# Patient Record
Sex: Female | Born: 1950 | Race: White | Hispanic: No | Marital: Married | Smoking: Never smoker
Health system: Southern US, Community
[De-identification: ages and names within clinical notes are randomized; demographics above are authoritative.]

## PROBLEM LIST (undated history)

## (undated) DIAGNOSIS — I4891 Unspecified atrial fibrillation: Secondary | ICD-10-CM

## (undated) DIAGNOSIS — G4733 Obstructive sleep apnea (adult) (pediatric): Secondary | ICD-10-CM

## (undated) DIAGNOSIS — J45909 Unspecified asthma, uncomplicated: Secondary | ICD-10-CM

## (undated) DIAGNOSIS — E119 Type 2 diabetes mellitus without complications: Secondary | ICD-10-CM

## (undated) DIAGNOSIS — I2510 Atherosclerotic heart disease of native coronary artery without angina pectoris: Secondary | ICD-10-CM

## (undated) DIAGNOSIS — I5030 Unspecified diastolic (congestive) heart failure: Secondary | ICD-10-CM

## (undated) DIAGNOSIS — E785 Hyperlipidemia, unspecified: Secondary | ICD-10-CM

## (undated) HISTORY — PX: ORIF DISTAL RADIUS FRACTURE: SUR927

## (undated) HISTORY — DX: Hyperlipidemia, unspecified: E78.5

## (undated) HISTORY — DX: Unspecified atrial fibrillation: I48.91

## (undated) HISTORY — DX: Unspecified asthma, uncomplicated: J45.909

## (undated) HISTORY — DX: Type 2 diabetes mellitus without complications: E11.9

## (undated) HISTORY — DX: Atherosclerotic heart disease of native coronary artery without angina pectoris: I25.10

## (undated) HISTORY — DX: Unspecified diastolic (congestive) heart failure: I50.30

## (undated) HISTORY — DX: Obstructive sleep apnea (adult) (pediatric): G47.33

---

## 2008-03-04 ENCOUNTER — Ambulatory Visit: Payer: Self-pay | Admitting: Emergency Medicine

## 2010-01-25 ENCOUNTER — Ambulatory Visit: Payer: Self-pay | Admitting: Family Medicine

## 2011-04-17 DIAGNOSIS — E782 Mixed hyperlipidemia: Secondary | ICD-10-CM | POA: Insufficient documentation

## 2011-04-17 DIAGNOSIS — I10 Essential (primary) hypertension: Secondary | ICD-10-CM | POA: Insufficient documentation

## 2011-04-28 DIAGNOSIS — M5440 Lumbago with sciatica, unspecified side: Secondary | ICD-10-CM | POA: Insufficient documentation

## 2011-06-16 ENCOUNTER — Ambulatory Visit: Payer: Self-pay | Admitting: Internal Medicine

## 2012-04-16 DIAGNOSIS — J45909 Unspecified asthma, uncomplicated: Secondary | ICD-10-CM | POA: Insufficient documentation

## 2013-01-15 DIAGNOSIS — G629 Polyneuropathy, unspecified: Secondary | ICD-10-CM | POA: Insufficient documentation

## 2013-01-15 DIAGNOSIS — R7989 Other specified abnormal findings of blood chemistry: Secondary | ICD-10-CM | POA: Insufficient documentation

## 2013-02-18 ENCOUNTER — Ambulatory Visit: Payer: Self-pay | Admitting: Family Medicine

## 2013-03-02 ENCOUNTER — Ambulatory Visit: Payer: Self-pay | Admitting: Family Medicine

## 2013-04-01 ENCOUNTER — Ambulatory Visit: Payer: Self-pay | Admitting: Family Medicine

## 2014-07-21 DIAGNOSIS — D72829 Elevated white blood cell count, unspecified: Secondary | ICD-10-CM | POA: Insufficient documentation

## 2014-08-20 DIAGNOSIS — I25119 Atherosclerotic heart disease of native coronary artery with unspecified angina pectoris: Secondary | ICD-10-CM | POA: Insufficient documentation

## 2014-09-10 DIAGNOSIS — Z6841 Body Mass Index (BMI) 40.0 and over, adult: Secondary | ICD-10-CM | POA: Insufficient documentation

## 2016-06-26 DIAGNOSIS — G4733 Obstructive sleep apnea (adult) (pediatric): Secondary | ICD-10-CM | POA: Insufficient documentation

## 2017-04-01 DEATH — deceased

## 2018-10-09 DIAGNOSIS — Z794 Long term (current) use of insulin: Secondary | ICD-10-CM | POA: Insufficient documentation

## 2018-10-09 DIAGNOSIS — D563 Thalassemia minor: Secondary | ICD-10-CM | POA: Insufficient documentation

## 2018-10-09 DIAGNOSIS — E1129 Type 2 diabetes mellitus with other diabetic kidney complication: Secondary | ICD-10-CM | POA: Insufficient documentation

## 2019-04-30 DIAGNOSIS — I4891 Unspecified atrial fibrillation: Secondary | ICD-10-CM | POA: Insufficient documentation

## 2020-05-31 ENCOUNTER — Ambulatory Visit (INDEPENDENT_AMBULATORY_CARE_PROVIDER_SITE_OTHER): Payer: Medicare Other | Admitting: Internal Medicine

## 2020-05-31 DIAGNOSIS — Z6841 Body Mass Index (BMI) 40.0 and over, adult: Secondary | ICD-10-CM | POA: Diagnosis not present

## 2020-05-31 DIAGNOSIS — J45909 Unspecified asthma, uncomplicated: Secondary | ICD-10-CM

## 2020-05-31 DIAGNOSIS — G4733 Obstructive sleep apnea (adult) (pediatric): Secondary | ICD-10-CM | POA: Diagnosis not present

## 2020-05-31 NOTE — Progress Notes (Signed)
[facility] [address] [place], [place] [address]  Pulmonary Sleep Medicine   Office Visit Note  Patient Name: Sheila Bush DOB: 1951-06-30 MRN [id number]    Chief Complaint: Obstructive Sleep Apnea visit  Brief History:  Sheila Bush is seen today for follow upThe patient has a 4 year history of sleep apnea. Patient is using PAP nightly.  The patient feels more rested after sleeping with PAP. She is averaging 9 hours and sleeps through the night. The patient reports benefiting from PAP use. Reported sleepiness is  Improved and the Epworth Sleepiness Score is 2 out of 24. The patient occasionally nods off after church if things are quiet. The patient complains of the following: occasional runny nose. She decreased the humidifier instead of increasing. She would like to try another style FF mask The compliance download shows excellent compliance with an average use time of 9 hours. The AHI is 1.7 The patient does not complain of limb movements disrupting sleep.  ROS  General: (-) fever, (-) chills, (-) night sweat Nose and Sinuses: (-) nasal stuffiness or itchiness, (-) postnasal drip, (-) nosebleeds, (-) sinus trouble. Mouth and Throat: (-) sore throat, (-) hoarseness. Neck: (-) swollen glands, (-) enlarged thyroid, (-) neck pain. Respiratory: - cough, + shortness of breath, - wheezing. Neurologic: - numbness, - tingling. Psychiatric: - anxiety, - depression   Current Medication: Outpatient Encounter Medications as of 05/31/2020  Medication Sig  . dronedarone (MULTAQ) 400 MG tablet Take by mouth.  . fexofenadine (ALLEGRA) 180 MG tablet Take by mouth.  Marland Kitchen glipiZIDE (GLUCOTROL) 5 MG tablet Take by mouth.  . insulin glargine (LANTUS) 100 UNIT/ML injection Inject into the skin.  Marland Kitchen meclizine (ANTIVERT) 12.5 MG tablet Take by mouth.  . metFORMIN (GLUCOPHAGE) 1000 MG tablet Take by mouth.  . metoprolol succinate (TOPROL-XL) 100 MG 24 hr tablet Take by mouth.  . Potassium  Gluconate 2.5 MEQ TABS Take by mouth.  . Semaglutide,0.25 or 0.5MG /DOS, (OZEMPIC, 0.25 OR 0.5 MG/DOSE,) 2 MG/1.5ML SOPN Inject into the skin.  Marland Kitchen albuterol (VENTOLIN HFA) 108 (90 Base) MCG/ACT inhaler Inhale into the lungs.  Marland Kitchen amLODipine (NORVASC) 5 MG tablet Take 5 mg by mouth at bedtime.  Marland Kitchen atorvastatin (LIPITOR) 40 MG tablet Take 40 mg by mouth at bedtime.  . benazepril-hydrochlorthiazide (LOTENSIN HCT) 20-25 MG tablet Take 1 tablet by mouth 2 (two) times daily.  . Calcium Carb-Cholecalciferol (OYSTER SHELL CALCIUM) 500-400 MG-UNIT TABS Take by mouth.  . Cholecalciferol 50 MCG (2000 UT) TABS Take by mouth.  . cyanocobalamin 1000 MCG tablet Take by mouth.  Everlene Balls 5 MG TABS tablet Take 5 mg by mouth 2 (two) times daily.  . folic acid (FOLVITE) 800 MCG tablet Take by mouth.  . furosemide (LASIX) 20 MG tablet Take 10 mg by mouth daily.   No facility-administered encounter medications on file as of 05/31/2020.    Surgical History: History reviewed. No pertinent surgical history.  Medical History: Past Medical History:  Diagnosis Date  . Atrial fibrillation (HCC)   . Coronary artery disease   . Diastolic heart failure (HCC)   . DM (diabetes mellitus), type 2 (HCC)   . Hyperlipidemia   . OSA on CPAP   . Reactive airway disease     Family History: Non contributory to the present illness  Social History: Social History   Socioeconomic History  . Marital status: Married    Spouse name: Not on file  . Number of children: Not on file  . Years  of education: Not on file  . Highest education level: Not on file  Occupational History  . Not on file  Tobacco Use  . Smoking status: Never Smoker  . Smokeless tobacco: Never Used  Substance and Sexual Activity  . Alcohol use: Never  . Drug use: Never  . Sexual activity: Not on file  Other Topics Concern  . Not on file  Social History Narrative  . Not on file   Social Determinants of Health   Financial Resource Strain:   .  Difficulty of Paying Living Expenses: Not on file  Food Insecurity:   . Worried About Programme researcher, broadcasting/film/video in the Last Year: Not on file  . Ran Out of Food in the Last Year: Not on file  Transportation Needs:   . Lack of Transportation (Medical): Not on file  . Lack of Transportation (Non-Medical): Not on file  Physical Activity:   . Days of Exercise per Week: Not on file  . Minutes of Exercise per Session: Not on file  Stress:   . Feeling of Stress : Not on file  Social Connections:   . Frequency of Communication with Friends and Family: Not on file  . Frequency of Social Gatherings with Friends and Family: Not on file  . Attends Religious Services: Not on file  . Active Member of Clubs or Organizations: Not on file  . Attends Banker Meetings: Not on file  . Marital Status: Not on file  Intimate Partner Violence:   . Fear of Current or Ex-Partner: Not on file  . Emotionally Abused: Not on file  . Physically Abused: Not on file  . Sexually Abused: Not on file    Vital Signs: Blood pressure 136/70, pulse 81, height 5\' 3"  (1.6 m), weight 239 lb (108.4 kg), SpO2 96 %.  Examination: General Appearance: The patient is well-developed, well-nourished, and in no distress. Neck Circumference: 37 Skin: Gross inspection of skin unremarkable. Head: normocephalic, no gross deformities. Eyes: no gross deformities noted. ENT: ears appear grossly normal Neurologic: Alert and oriented. No involuntary movements.    EPWORTH SLEEPINESS SCALE:  Scale:  (0)= no chance of dozing; (1)= slight chance of dozing; (2)= moderate chance of dozing; (3)= high chance of dozing  Chance  Situtation    Sitting and reading: 0    Watching TV: 1    Sitting Inactive in public: 0    As a passenger in car: 0      Lying down to rest: 1    Sitting and talking: 0    Sitting quielty after lunch: 0    In a car, stopped in traffic: 0   TOTAL SCORE:   2 out of 24    SLEEP  STUDIES:  Split 06/2016 AHI 39 SpO71min 88%, CPAP 10 cm H2O  CPAP COMPLIANCE DATA:  Date Range: 06/02/19-05/26/20  Average Daily Use: 9 hours  Median Use: 9.1  Compliance for > 4 Hours: 100%  AHI: 1.7 respiratory events per hour  Days Used: 360/360  Mask Leak: 8.1  95th Percentile Pressure: 10  LABS: No results found for this or any previous visit (from the past 2160 hour(s)).  Radiology: No results found.   Assessment and Plan: Patient Active Problem List   Diagnosis Date Noted  . Obstructive sleep apnea 06/26/2016  . Morbid obesity with BMI of 40.0-44.9, adult (HCC) 09/10/2014  . RAD (reactive airway disease) 04/16/2012      The patient does tolerate PAP and reports significant benefit  from PAP use. The patient was reminded to continue good care of her machine and advised to increase the humidifier setting. The patient was also counselled to continue watching her diet. She has lost a few lbs since her last visit. The compliance is excellent. The apnea is very well controlled. She may wish to try a different mask style and will meet with the tech for this.   1. OSA- continue using CPAP regularly. 2. Morbid obesity- continue watching diet. 3. RAD-Breathing remains stable at this time. Encouraged to continue to follow-up with PCP/pulmonologist as needed.  General Counseling: I have discussed the findings of the evaluation and examination with Sheila Bush.  I have also discussed any further diagnostic evaluation thatmay be needed or ordered today. Sheila Bush verbalizes understanding of the findings of todays visit. We also reviewed her medications today and discussed drug interactions and side effects including but not limited excessive drowsiness and altered mental states. We also discussed that there is always a risk not just to her but also people around her. she has been encouraged to call the office with any questions or concerns that should arise related to todays visit.    This  patient was seen by Leeanne Deed AGNP-C in Collaboration with Dr. Freda Munro as a part of collaborative care agreement.    I have personally obtained a history, examined the patient, evaluated laboratory and imaging results, formulated the assessment and plan and placed orders.   Sheila Hue Sol Blazing, PhD, FAASM  Diplomate, American Board of Sleep Medicine    Sheila Pax, MD [facility] Diplomate ABMS Pulmonary and Critical Care Medicine Sleep medicine

## 2020-05-31 NOTE — Patient Instructions (Signed)

## 2020-07-02 ENCOUNTER — Emergency Department
Admission: EM | Admit: 2020-07-02 | Discharge: 2020-07-02 | Disposition: A | Discharge status: Home / Self Care | Payer: Medicare Other | Attending: Emergency Medicine | Admitting: Emergency Medicine

## 2020-07-02 ENCOUNTER — Emergency Department: Payer: Medicare Other

## 2020-07-02 ENCOUNTER — Other Ambulatory Visit: Payer: Self-pay

## 2020-07-02 ENCOUNTER — Encounter: Payer: Self-pay | Admitting: Emergency Medicine

## 2020-07-02 DIAGNOSIS — Z7901 Long term (current) use of anticoagulants: Secondary | ICD-10-CM | POA: Diagnosis not present

## 2020-07-02 DIAGNOSIS — I5032 Chronic diastolic (congestive) heart failure: Secondary | ICD-10-CM | POA: Insufficient documentation

## 2020-07-02 DIAGNOSIS — I2510 Atherosclerotic heart disease of native coronary artery without angina pectoris: Secondary | ICD-10-CM | POA: Diagnosis not present

## 2020-07-02 DIAGNOSIS — Z91040 Latex allergy status: Secondary | ICD-10-CM | POA: Diagnosis not present

## 2020-07-02 DIAGNOSIS — E119 Type 2 diabetes mellitus without complications: Secondary | ICD-10-CM | POA: Diagnosis not present

## 2020-07-02 DIAGNOSIS — S0181XA Laceration without foreign body of other part of head, initial encounter: Secondary | ICD-10-CM | POA: Insufficient documentation

## 2020-07-02 DIAGNOSIS — S0990XA Unspecified injury of head, initial encounter: Secondary | ICD-10-CM | POA: Diagnosis present

## 2020-07-02 DIAGNOSIS — Z79899 Other long term (current) drug therapy: Secondary | ICD-10-CM | POA: Insufficient documentation

## 2020-07-02 DIAGNOSIS — Z794 Long term (current) use of insulin: Secondary | ICD-10-CM | POA: Insufficient documentation

## 2020-07-02 DIAGNOSIS — W19XXXA Unspecified fall, initial encounter: Secondary | ICD-10-CM

## 2020-07-02 DIAGNOSIS — W010XXA Fall on same level from slipping, tripping and stumbling without subsequent striking against object, initial encounter: Secondary | ICD-10-CM | POA: Insufficient documentation

## 2020-07-02 MED ORDER — LORAZEPAM 2 MG/ML IJ SOLN: 1.0000 mg | Freq: Once | INTRAMUSCULAR | Status: DC

## 2020-07-02 NOTE — ED Provider Notes (Signed)
[facility] Emergency Department Provider Note  Time seen: 1:15 PM  I have reviewed the triage vital signs and the nursing notes.   HISTORY  Chief Complaint Fall   HPI [name] is a 69 y.o. female with a past medical history of atrial fibrillation on Eliquis, diabetes, hyperlipidemia, obesity, presents emergency department after a fall.  According to EMS patient was attempting to walk up onto a curb tripped and fell onto her right side.  Patient did hit her head has a laceration to her right forehead, abrasion to her right forehead, some pain to her right arm as well as right knee.  Patient does take chronic pain to her knees had a corticosteroid injection yesterday.  Patient denies LOC.  Past Medical History:  Diagnosis Date  . Atrial fibrillation (HCC)   . Coronary artery disease   . Diastolic heart failure (HCC)   . DM (diabetes mellitus), type 2 (HCC)   . Hyperlipidemia   . OSA on CPAP   . Reactive airway disease     Patient Active Problem List   Diagnosis Date Noted  . Obstructive sleep apnea 06/26/2016  . Morbid obesity with BMI of 40.0-44.9, adult (HCC) 09/10/2014  . RAD (reactive airway disease) 04/16/2012    History reviewed. No pertinent surgical history.  Prior to Admission medications   Medication Sig Start Date End Date Taking? Authorizing Provider  albuterol (VENTOLIN HFA) 108 (90 Base) MCG/ACT inhaler Inhale into the lungs.    [provider]  amLODipine (NORVASC) 5 MG tablet Take 5 mg by mouth at bedtime. 05/03/20   [provider]  atorvastatin (LIPITOR) 40 MG tablet Take 40 mg by mouth at bedtime. 04/28/20   [provider]  benazepril-hydrochlorthiazide (LOTENSIN HCT) 20-25 MG tablet Take 1 tablet by mouth 2 (two) times daily. 01/28/20   [provider]  Calcium Carb-Cholecalciferol (OYSTER SHELL CALCIUM) 500-400 MG-UNIT TABS Take by mouth.    [provider]  Cholecalciferol 50 MCG (2000  UT) TABS Take by mouth.    [provider]  cyanocobalamin 1000 MCG tablet Take by mouth.    [provider]  dronedarone (MULTAQ) 400 MG tablet Take by mouth. 08/20/19 08/19/20  [provider]  ELIQUIS 5 MG TABS tablet Take 5 mg by mouth 2 (two) times daily. 05/18/20   [provider]  fexofenadine (ALLEGRA) 180 MG tablet Take by mouth. 07/11/16   [provider]  folic acid (FOLVITE) 800 MCG tablet Take by mouth.    [provider]  furosemide (LASIX) 20 MG tablet Take 10 mg by mouth daily. 12/16/19   [provider]  glipiZIDE (GLUCOTROL) 5 MG tablet Take by mouth. 05/01/19   [provider]  insulin glargine (LANTUS) 100 UNIT/ML injection Inject into the skin. 02/06/20 02/05/21  [provider]  meclizine (ANTIVERT) 12.5 MG tablet Take by mouth. 03/17/20   [provider]  metFORMIN (GLUCOPHAGE) 1000 MG tablet Take by mouth. 05/26/20   [provider]  metoprolol succinate (TOPROL-XL) 100 MG 24 hr tablet Take by mouth. 05/26/19   [provider]  Potassium Gluconate 2.5 MEQ TABS Take by mouth. 12/10/06   [provider]  Semaglutide,0.25 or 0.5MG /DOS, (OZEMPIC, 0.25 OR 0.5 MG/DOSE,) 2 MG/1.5ML SOPN Inject into the skin. 10/11/19   [provider]    Allergies  Allergen Reactions  . Latex Other (See Comments)    Skin tears and irritation.  . Codeine Other (See Comments)    "  Really bad stomach cramps."  . Dextromethorphan-Guaifenesin Other (See Comments)    "It was getting hard to breath."    No family history on file.  Social History Social History   Tobacco Use  . Smoking status: Never Smoker  . Smokeless tobacco: Never Used  Substance Use Topics  . Alcohol use: Never  . Drug use: Never    Review of Systems Constitutional: Negative for loss of consciousness Cardiovascular: Negative for chest pain. Respiratory: Negative for shortness of  breath. Gastrointestinal: Negative for abdominal pain, vomiting Genitourinary: Negative for urinary compaints Musculoskeletal: Right forearm and right knee pain. Neurological: Negative for headache All other ROS negative  ____________________________________________   PHYSICAL EXAM:  VITAL SIGNS: ED Triage Vitals  Enc Vitals Group     BP 07/02/20 1251 (!) 156/60     Pulse Rate 07/02/20 1251 77     Resp 07/02/20 1251 18     Temp 07/02/20 1251 98.9 F (37.2 C)     Temp Source 07/02/20 1251 Oral     SpO2 07/02/20 1251 97 %     Weight 07/02/20 1247 238 lb 1.6 oz (108 kg)     Height 07/02/20 1247 5\' 3"  (1.6 m)     Head Circumference --      Peak Flow --      Pain Score 07/02/20 1247 9     Pain Loc --      Pain Edu? --      Excl. in GC? --     Constitutional: Alert and oriented.  No distress. Eyes: Small subconjunctival hemorrhage to right sclera.  Otherwise normal eye exams. ENT      Head: Abrasion to right forehead with a 1.5 cm vertical laceration to the right forehead, hemostatic      Mouth/Throat: Mucous membranes are moist. Cardiovascular: Normal rate, regular rhythm. No murmur Respiratory: Normal respiratory effort without tachypnea nor retractions. Breath sounds are clear Gastrointestinal: Soft and nontender. No distention.  Musculoskeletal: Patient does have mild pain to the right forearm overlying abrasion, great range of motion in all joints, no concern for fracture.  Patient does have abrasions overlying each knee, great range of motion the left knee she does have pain with range of motion in the right knee however states this is chronic however as a precaution given the fall we will obtain x-ray images of that.  No CT or L-spine tenderness. Neurologic:  Normal speech and language. No gross focal neurologic deficits Skin: Abrasion to right forehead with laceration to right forehead as described above.  Abrasion to bilateral knees which is small.  Small abrasion to  right elbow/forearm. Psychiatric: Mood and affect are norma  ____________________________________________   RADIOLOGY  X-ray is negative. CT scans are negative for acute concerning abnormality.  ____________________________________________   INITIAL IMPRESSION / ASSESSMENT AND PLAN / ED COURSE  Pertinent labs & imaging results that were available during my care of the patient were reviewed by me and considered in my medical decision making (see chart for details).   Patient presents to the emergency department after mechanical fall.  Patient has abrasions to her right elbow/forearm, right head, laceration to the right forehead, abrasions to both knees.  Overall the patient appears well great range of motion in all extremities does have some pain with range of motion the right lower extremity which she states is chronic we will obtain an x-ray images a precaution of the right knee.  Patient did hit her head is on Eliquis we will  obtain CT imaging of the head face and neck as precaution.  Patient does have a 1.5 cm vertical laceration to the right forehead it is nongaping and hemostatic will likely be able to repair with Dermabond.  Patient states she has extreme claustrophobia and cannot go through with a CT scan during the last ER visit.  I discussed with the patient how quickly CT can be she states she will try but is requesting medication we will dose Ativan prior to the CT imaging.  CT scans are negative for concerning abnormalities.  X-rays negative.  Patient did have a hematoma overlying the right forehead, bleeding resumed from the laceration, applied a pressure dressing to achieve hemostasis.  Then covered with Dermabond we will discharge with a head dressing discussed with the patient taking the dressing off at 8 PM tonight and then covering with a bandage.  Patient has been agreeable to plan of care.  Patient will use Tylenol for pain.  DENIESHA STENGLEIN was evaluated in Emergency  Department on 07/02/2020 for the symptoms described in the history of present illness. She was evaluated in the context of the global COVID-19 pandemic, which necessitated consideration that the patient might be at risk for infection with the SARS-CoV-2 virus that causes COVID-19. Institutional protocols and algorithms that pertain to the evaluation of patients at risk for COVID-19 are in a state of rapid change based on information released by regulatory bodies including the CDC and federal and state organizations. These policies and algorithms were followed during the patient's care in the ED.  ____________________________________________   FINAL CLINICAL IMPRESSION(S) / ED DIAGNOSES  Fall Head injury Abrasions Laceration   Minna Antis, MD 07/02/20 1526

## 2020-07-02 NOTE — Discharge Instructions (Addendum)
As discussed please keep your head bandage on until 8 PM tonight then you may remove it and keep covered with a bandage.  Return to the emergency department for any significant bleeding, any significant pain, or any other symptom personally concerning to yourself.  Otherwise please use Tylenol for discomfort as needed, as written on the box.

## 2021-06-10 NOTE — Progress Notes (Signed)
[facility] [address] [place], [place] [id number]  Pulmonary Sleep Medicine   Office Visit Note  Patient Name: Sheila IsaacSusan R Scheer DOB: 04/13/1951 MRN [id number]    Chief Complaint: Obstructive Sleep Apnea visit  Brief History:  Darl PikesSusan is seen today for 1 year follow The patient has a 5 year history of sleep apnea. Patient is using PAP nightly.  The patient feels better after sleeping with PAP.  The patient reports benefiting from PAP use. Reported sleepiness is  much better and the Epworth Sleepiness Score is 1 out of 24. The patient does take 20 minute naps. On weekends The patient has no compliants, but may wahat to try   The compliance download shows  compliance with an average use time of 8:05 hours @ 92%. The AHI is 1.7  The patient does not complain of limb movements disrupting sleep.  ROS  General: (-) fever, (-) chills, (-) night sweat Nose and Sinuses: (-) nasal stuffiness or itchiness, (-) postnasal drip, (-) nosebleeds, (-) sinus trouble. Mouth and Throat: (-) sore throat, (-) hoarseness. Neck: (-) swollen glands, (-) enlarged thyroid, (-) neck pain. Respiratory: - cough, - shortness of breath, - wheezing. Neurologic: + numbness, + tingling. Psychiatric: - anxiety, - depression   Current Medication: Outpatient Encounter Medications as of 06/13/2021  Medication Sig   amLODipine (NORVASC) 5 MG tablet Take by mouth.   apixaban (ELIQUIS) 5 MG TABS tablet Take 1 tablet by mouth 2 (two) times daily.   atorvastatin (LIPITOR) 40 MG tablet Take by mouth.   dronedarone (MULTAQ) 400 MG tablet Take by mouth.   glipiZIDE (GLUCOTROL) 5 MG tablet Take by mouth.   insulin glargine (LANTUS) 100 UNIT/ML injection Inject into the skin.   metFORMIN (GLUCOPHAGE) 1000 MG tablet Take by mouth.   metoprolol succinate (TOPROL-XL) 100 MG 24 hr tablet Take by mouth.   Semaglutide,0.25 or 0.5MG /DOS, (OZEMPIC, 0.25 OR 0.5 MG/DOSE,) 2 MG/1.5ML SOPN Inject into the skin.   Acetaminophen  500 MG capsule Take by mouth.   albuterol (VENTOLIN HFA) 108 (90 Base) MCG/ACT inhaler Inhale into the lungs.   amLODipine (NORVASC) 5 MG tablet Take 5 mg by mouth at bedtime.   atorvastatin (LIPITOR) 40 MG tablet Take 40 mg by mouth at bedtime.   benazepril-hydrochlorthiazide (LOTENSIN HCT) 20-25 MG tablet Take 1 tablet by mouth 2 (two) times daily.   Calcium Carb-Cholecalciferol (OYSTER SHELL CALCIUM) 500-400 MG-UNIT TABS Take by mouth.   Cholecalciferol 50 MCG (2000 UT) TABS Take by mouth.   Cholecalciferol 50 MCG (2000 UT) TABS Take by mouth.   cyanocobalamin 1000 MCG tablet Take by mouth.   dronedarone (MULTAQ) 400 MG tablet Take by mouth.   ELIQUIS 5 MG TABS tablet Take 5 mg by mouth 2 (two) times daily.   fexofenadine (ALLEGRA) 180 MG tablet Take by mouth.   folic acid (FOLVITE) 800 MCG tablet Take by mouth.   furosemide (LASIX) 20 MG tablet Take 10 mg by mouth daily.   glipiZIDE (GLUCOTROL) 5 MG tablet Take by mouth.   insulin glargine (LANTUS) 100 UNIT/ML injection Inject into the skin.   meclizine (ANTIVERT) 12.5 MG tablet Take by mouth.   metFORMIN (GLUCOPHAGE) 1000 MG tablet Take by mouth.   metoprolol succinate (TOPROL-XL) 100 MG 24 hr tablet Take by mouth.   Potassium Gluconate 2.5 MEQ TABS Take by mouth.   Semaglutide,0.25 or 0.5MG /DOS, (OZEMPIC, 0.25 OR 0.5 MG/DOSE,) 2 MG/1.5ML SOPN Inject into the skin.   No facility-administered encounter medications on file as  of 06/13/2021.    Surgical History: Past Surgical History:  Procedure Laterality Date   CESAREAN SECTION     ORIF DISTAL RADIUS FRACTURE      Medical History: Past Medical History:  Diagnosis Date   Atrial fibrillation (HCC)    Coronary artery disease    Diastolic heart failure (HCC)    DM (diabetes mellitus), type 2 (HCC)    Hyperlipidemia    OSA on CPAP    Reactive airway disease     Family History: Non contributory to the present illness  Social History: Social History   Socioeconomic  History   Marital status: Married    Spouse name: Not on file   Number of children: Not on file   Years of education: Not on file   Highest education level: Not on file  Occupational History   Not on file  Tobacco Use   Smoking status: Never   Smokeless tobacco: Never  Substance and Sexual Activity   Alcohol use: Never   Drug use: Never   Sexual activity: Not on file  Other Topics Concern   Not on file  Social History Narrative   Not on file   Social Determinants of Health   Financial Resource Strain: Not on file  Food Insecurity: Not on file  Transportation Needs: Not on file  Physical Activity: Not on file  Stress: Not on file  Social Connections: Not on file  Intimate Partner Violence: Not on file    Vital Signs: Blood pressure 138/77, resp. rate 18, height 5\' 3"  (1.6 m), weight 233 lb (105.7 kg), SpO2 95 %.  Examination: General Appearance: The patient is well-developed, well-nourished, and in no distress. Neck Circumference: 37 cm Skin: Gross inspection of skin unremarkable. Head: normocephalic, no gross deformities. Eyes: no gross deformities noted. ENT: ears appear grossly normal Neurologic: Alert and oriented. No involuntary movements.    EPWORTH SLEEPINESS SCALE:  Scale:  (0)= no chance of dozing; (1)= slight chance of dozing; (2)= moderate chance of dozing; (3)= high chance of dozing  Chance  Situtation    Sitting and reading: 0    Watching TV: 1    Sitting Inactive in public: 0    As a passenger in car: 0      Lying down to rest: 0    Sitting and talking: 0    Sitting quielty after lunch: 0    In a car, stopped in traffic: 0   TOTAL SCORE:   1 out of 24    SLEEP STUDIES:  Split Study 06/19/2016  -   AHI 39, low SpO2 88%   CPAP COMPLIANCE DATA:  Date Range: 06/09/20 - 06/08/21  Average Daily Use: 8:05 hours  Median Use: 8:52 hours  Compliance for > 4 Hours: 92%  AHI: 1.7 respiratory events per hour  Days Used:  341/365  Mask Leak: 6.8 lpm  95th Percentile Pressure: 10 cmH2O   LABS: No results found for this or any previous visit (from the past 2160 hour(s)).  Radiology: CT Head Wo Contrast  Result Date: 07/02/2020 CLINICAL DATA:  Patient tripped on curb getting out of her car today. Cut right side of forehead. Abrasions below right eye and both knees.Facial trauma EXAM: CT HEAD WITHOUT CONTRAST CT MAXILLOFACIAL WITHOUT CONTRAST CT CERVICAL SPINE WITHOUT CONTRAST TECHNIQUE: Multidetector CT imaging of the head, cervical spine, and maxillofacial structures were performed using the standard protocol without intravenous contrast. Multiplanar CT image reconstructions of the cervical spine and maxillofacial structures were also  generated. COMPARISON:  None. FINDINGS: CT HEAD FINDINGS Brain: No acute intracranial hemorrhage. No focal mass lesion. No CT evidence of acute infarction. No midline shift or mass effect. No hydrocephalus. Basilar cisterns are patent. Vascular: No hyperdense vessel or unexpected calcification. Skull: Normal. Negative for fracture or focal lesion. Incidental hyperostosis frontalis. Sinuses/Orbits: Paranasal sinuses and mastoid air cells are clear. Orbits are clear. Other: Shallow scalp hematoma over the RIGHT frontal bone. CT MAXILLOFACIAL FINDINGS Osseous: Orbital rims are intact. Maxillary sinus walls are intact. Pterygoid plates are normal. Segment arches are normal. Mandibular condyles are located. No mandibular fracture. Orbits: No proptosis. Globes intact. Intraconal contents are normal. Mild swelling the preseptal soft tissue over the RIGHT eye. Sinuses: No fluid in the paranasal sinuses Soft tissues: . small RIGHT frontal scalp hematoma CT CERVICAL SPINE FINDINGS Alignment: Normal alignment of the cervical vertebral bodies. Skull base and vertebrae: Normal craniocervical junction. No loss of vertebral body height or disc height. Normal facet articulation. No evidence of fracture. Soft  tissues and spinal canal: No prevertebral soft tissue swelling. No perispinal or epidural hematoma. Disc levels:  Unremarkable Upper chest: Clear Other: None IMPRESSION: 1. No intracranial trauma. 2. Shallow RIGHT frontal scalp hematoma.  No skull fracture 3. Mild soft tissue preseptal swelling on the RIGHT. No orbital injury. 4. No facial bone fracture. 5. No cervical spine fracture Electronically Signed   By: Genevive Bi M.D.   On: 07/02/2020 13:41   CT Cervical Spine Wo Contrast  Result Date: 07/02/2020 CLINICAL DATA:  Patient tripped on curb getting out of her car today. Cut right side of forehead. Abrasions below right eye and both knees.Facial trauma EXAM: CT HEAD WITHOUT CONTRAST CT MAXILLOFACIAL WITHOUT CONTRAST CT CERVICAL SPINE WITHOUT CONTRAST TECHNIQUE: Multidetector CT imaging of the head, cervical spine, and maxillofacial structures were performed using the standard protocol without intravenous contrast. Multiplanar CT image reconstructions of the cervical spine and maxillofacial structures were also generated. COMPARISON:  None. FINDINGS: CT HEAD FINDINGS Brain: No acute intracranial hemorrhage. No focal mass lesion. No CT evidence of acute infarction. No midline shift or mass effect. No hydrocephalus. Basilar cisterns are patent. Vascular: No hyperdense vessel or unexpected calcification. Skull: Normal. Negative for fracture or focal lesion. Incidental hyperostosis frontalis. Sinuses/Orbits: Paranasal sinuses and mastoid air cells are clear. Orbits are clear. Other: Shallow scalp hematoma over the RIGHT frontal bone. CT MAXILLOFACIAL FINDINGS Osseous: Orbital rims are intact. Maxillary sinus walls are intact. Pterygoid plates are normal. Segment arches are normal. Mandibular condyles are located. No mandibular fracture. Orbits: No proptosis. Globes intact. Intraconal contents are normal. Mild swelling the preseptal soft tissue over the RIGHT eye. Sinuses: No fluid in the paranasal sinuses  Soft tissues: . small RIGHT frontal scalp hematoma CT CERVICAL SPINE FINDINGS Alignment: Normal alignment of the cervical vertebral bodies. Skull base and vertebrae: Normal craniocervical junction. No loss of vertebral body height or disc height. Normal facet articulation. No evidence of fracture. Soft tissues and spinal canal: No prevertebral soft tissue swelling. No perispinal or epidural hematoma. Disc levels:  Unremarkable Upper chest: Clear Other: None IMPRESSION: 1. No intracranial trauma. 2. Shallow RIGHT frontal scalp hematoma.  No skull fracture 3. Mild soft tissue preseptal swelling on the RIGHT. No orbital injury. 4. No facial bone fracture. 5. No cervical spine fracture Electronically Signed   By: Genevive Bi M.D.   On: 07/02/2020 13:41   DG Knee Complete 4 Views Right  Result Date: 07/02/2020 CLINICAL DATA:  Left knee pain after fall. EXAM:  RIGHT KNEE - COMPLETE 4+ VIEW COMPARISON:  None. FINDINGS: No evidence of fracture, dislocation, or joint effusion. No evidence of arthropathy or other focal bone abnormality. Vascular calcifications are noted. IMPRESSION: Negative. Electronically Signed   By: Lupita Raider M.D.   On: 07/02/2020 13:47   CT Maxillofacial Wo Contrast  Result Date: 07/02/2020 CLINICAL DATA:  Patient tripped on curb getting out of her car today. Cut right side of forehead. Abrasions below right eye and both knees.Facial trauma EXAM: CT HEAD WITHOUT CONTRAST CT MAXILLOFACIAL WITHOUT CONTRAST CT CERVICAL SPINE WITHOUT CONTRAST TECHNIQUE: Multidetector CT imaging of the head, cervical spine, and maxillofacial structures were performed using the standard protocol without intravenous contrast. Multiplanar CT image reconstructions of the cervical spine and maxillofacial structures were also generated. COMPARISON:  None. FINDINGS: CT HEAD FINDINGS Brain: No acute intracranial hemorrhage. No focal mass lesion. No CT evidence of acute infarction. No midline shift or mass effect. No  hydrocephalus. Basilar cisterns are patent. Vascular: No hyperdense vessel or unexpected calcification. Skull: Normal. Negative for fracture or focal lesion. Incidental hyperostosis frontalis. Sinuses/Orbits: Paranasal sinuses and mastoid air cells are clear. Orbits are clear. Other: Shallow scalp hematoma over the RIGHT frontal bone. CT MAXILLOFACIAL FINDINGS Osseous: Orbital rims are intact. Maxillary sinus walls are intact. Pterygoid plates are normal. Segment arches are normal. Mandibular condyles are located. No mandibular fracture. Orbits: No proptosis. Globes intact. Intraconal contents are normal. Mild swelling the preseptal soft tissue over the RIGHT eye. Sinuses: No fluid in the paranasal sinuses Soft tissues: . small RIGHT frontal scalp hematoma CT CERVICAL SPINE FINDINGS Alignment: Normal alignment of the cervical vertebral bodies. Skull base and vertebrae: Normal craniocervical junction. No loss of vertebral body height or disc height. Normal facet articulation. No evidence of fracture. Soft tissues and spinal canal: No prevertebral soft tissue swelling. No perispinal or epidural hematoma. Disc levels:  Unremarkable Upper chest: Clear Other: None IMPRESSION: 1. No intracranial trauma. 2. Shallow RIGHT frontal scalp hematoma.  No skull fracture 3. Mild soft tissue preseptal swelling on the RIGHT. No orbital injury. 4. No facial bone fracture. 5. No cervical spine fracture Electronically Signed   By: Genevive Bi M.D.   On: 07/02/2020 13:41    No results found.  No results found.    Assessment and Plan: Patient Active Problem List   Diagnosis Date Noted   Diastolic heart failure (HCC) 06/13/2021   OSA on CPAP 06/13/2021   CPAP use counseling 06/13/2021   Atrial fibrillation (HCC) 06/13/2021   Atrial fibrillation, new onset (HCC) 04/30/2019   Beta thalassemia trait 10/09/2018   Type 2 diabetes mellitus with microalbuminuria, with long-term current use of insulin (HCC) 10/09/2018    Obstructive sleep apnea 06/26/2016   Morbid obesity with BMI of 40.0-44.9, adult (HCC) 09/10/2014   Coronary artery disease involving native coronary artery of native heart with angina pectoris (HCC) 08/20/2014   RAD (reactive airway disease) 04/16/2012   Low back pain with sciatica 04/28/2011   Essential hypertension 04/17/2011    1. OSA on CPAP The patient does tolerate PAP and reports definite benefit from PAP use. The patient was reminded how to clean equipment and advised to replace supplies routinely. The patient was also counselled on weight loss . The compliance is very good . The AHI is 1.7.   OSA- continue PAP with very good compliance. F/u one year.    2. CPAP use counseling CPAP Counseling: had a lengthy discussion with the patient regarding the importance of PAP therapy  in management of the sleep apnea. Patient appears to understand the risk factor reduction and also understands the risks associated with untreated sleep apnea. Patient will try to make a good faith effort to remain compliant with therapy. Also instructed the patient on proper cleaning of the device including the water must be changed daily if possible and use of distilled water is preferred. Patient understands that the machine should be regularly cleaned with appropriate recommended cleaning solutions that do not damage the PAP machine for example given white vinegar and water rinses. Other methods such as ozone treatment may not be as good as these simple methods to achieve cleaning.   3. Atrial fibrillation, unspecified type (HCC) Reminded of importance of treating OSA in light of same. Stable.   4. Morbid obesity with BMI of 40.0-44.9, adult (HCC) Obesity Counseling: Had a lengthy discussion regarding patients BMI and weight issues. Patient was instructed on portion control as well as increased activity. Also discussed caloric restrictions with trying to maintain intake less than 2000 Kcal. Discussions were made  in accordance with the 5As of weight management. Simple actions such as not eating late and if able to, taking a walk is suggested.     General Counseling: I have discussed the findings of the evaluation and examination with Kenyette.  I have also discussed any further diagnostic evaluation thatmay be needed or ordered today. Datra verbalizes understanding of the findings of todays visit. We also reviewed her medications today and discussed drug interactions and side effects including but not limited excessive drowsiness and altered mental states. We also discussed that there is always a risk not just to her but also people around her. she has been encouraged to call the office with any questions or concerns that should arise related to todays visit.  No orders of the defined types were placed in this encounter.       I have personally obtained a history, examined the patient, evaluated laboratory and imaging results, formulated the assessment and plan and placed orders.   This patient was seen today by Emmaline Kluver, PA-C in collaboration with Dr. Freda Munro.     Yevonne Pax, MD [facility] Diplomate ABMS Pulmonary and Critical Care Medicine Sleep medicine

## 2021-06-13 ENCOUNTER — Other Ambulatory Visit: Payer: Self-pay

## 2021-06-13 ENCOUNTER — Ambulatory Visit (INDEPENDENT_AMBULATORY_CARE_PROVIDER_SITE_OTHER): Payer: Medicare Other | Admitting: Internal Medicine

## 2021-06-13 VITALS — BP 138/77 | Resp 18 | Ht 63.0 in | Wt 233.0 lb

## 2021-06-13 DIAGNOSIS — Z9989 Dependence on other enabling machines and devices: Secondary | ICD-10-CM

## 2021-06-13 DIAGNOSIS — Z7189 Other specified counseling: Secondary | ICD-10-CM | POA: Diagnosis not present

## 2021-06-13 DIAGNOSIS — Z6841 Body Mass Index (BMI) 40.0 and over, adult: Secondary | ICD-10-CM

## 2021-06-13 DIAGNOSIS — G4733 Obstructive sleep apnea (adult) (pediatric): Secondary | ICD-10-CM | POA: Diagnosis not present

## 2021-06-13 DIAGNOSIS — I4891 Unspecified atrial fibrillation: Secondary | ICD-10-CM

## 2021-06-13 DIAGNOSIS — I5030 Unspecified diastolic (congestive) heart failure: Secondary | ICD-10-CM | POA: Insufficient documentation

## 2021-06-13 NOTE — Patient Instructions (Signed)

## 2022-02-28 IMAGING — CT CT CERVICAL SPINE W/O CM
3 of 4 series · 12 of 33 positions shown, 14 images · non-contrast
Comparison: None.

CLINICAL DATA: Patient tripped on curb getting out of her car
today. Cut right side of forehead. Abrasions below right eye and
both knees.Facial trauma

EXAM:
CT HEAD WITHOUT CONTRAST
CT MAXILLOFACIAL WITHOUT CONTRAST
CT CERVICAL SPINE WITHOUT CONTRAST
TECHNIQUE: Multidetector CT imaging of the head, cervical spine, and
maxillofacial structures were performed using the standard protocol
without intravenous contrast. Multiplanar CT image reconstructions
of the cervical spine and maxillofacial structures were also
generated.

[Series 5: sagittal bone · sagittal · 0.21mm/px · 5 of 61 slices shown, 6 images]
[im 21/61  bone]
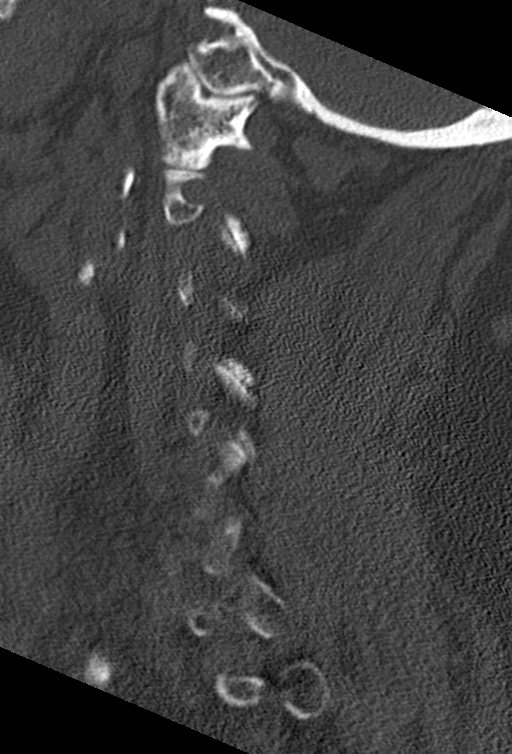
[im 26/61  bone]
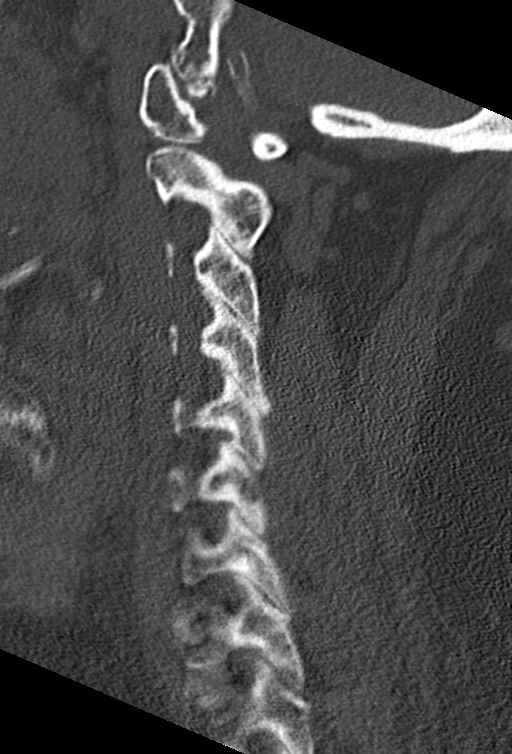
[im 31/61  soft-tissue]
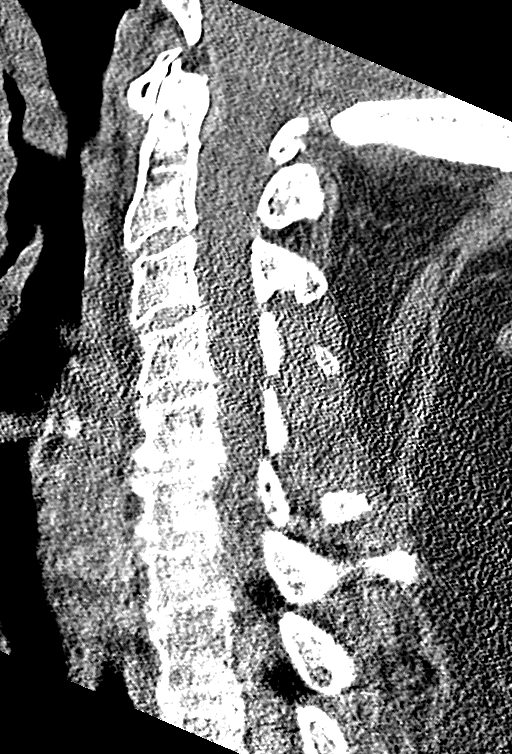
[im 31/61  bone]
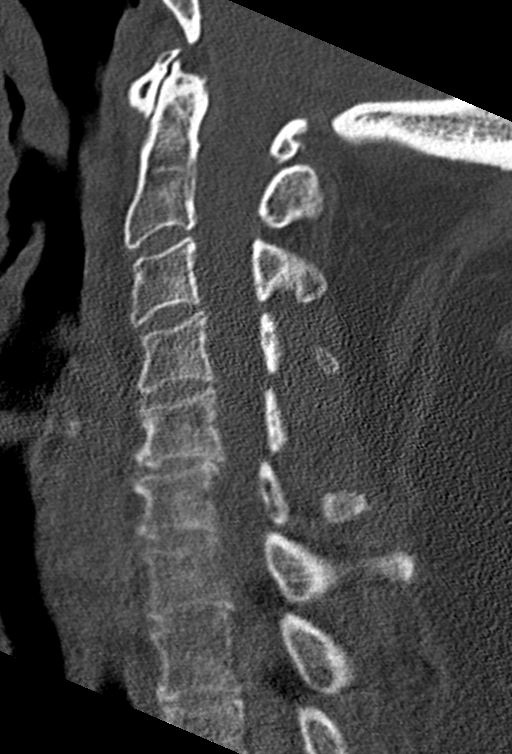
[im 36/61  bone]
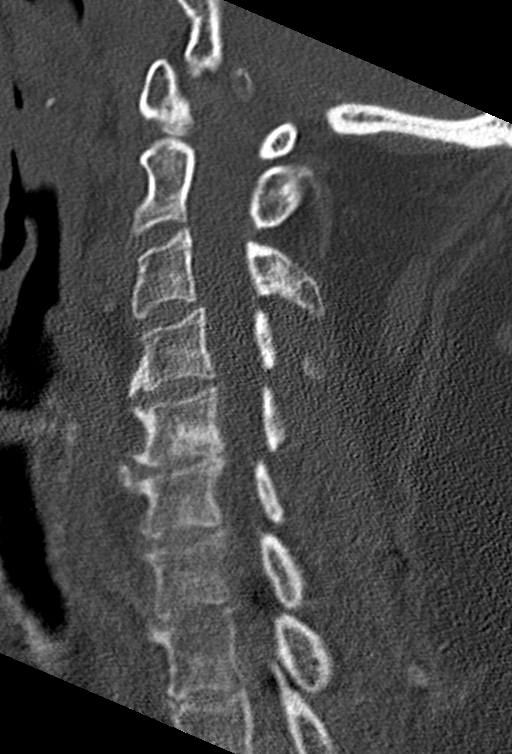
[im 41/61  bone]
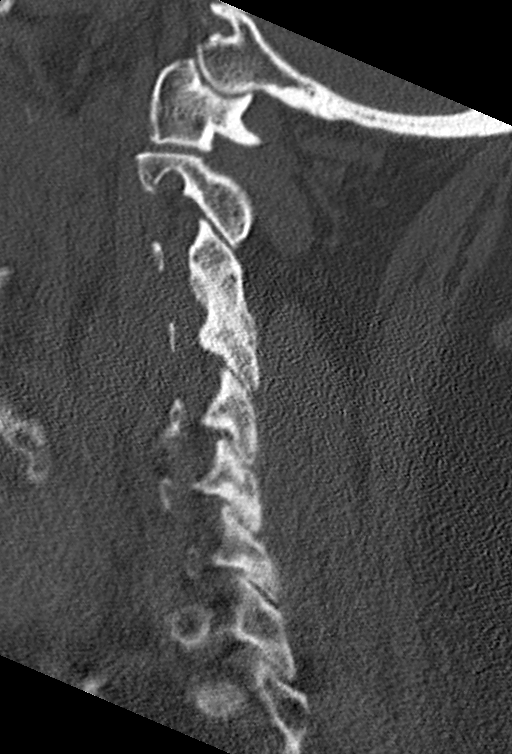

[Series 6: coronal bone · coronal · 0.24mm/px · 3 of 56 slices shown]
[im 12/56  bone]
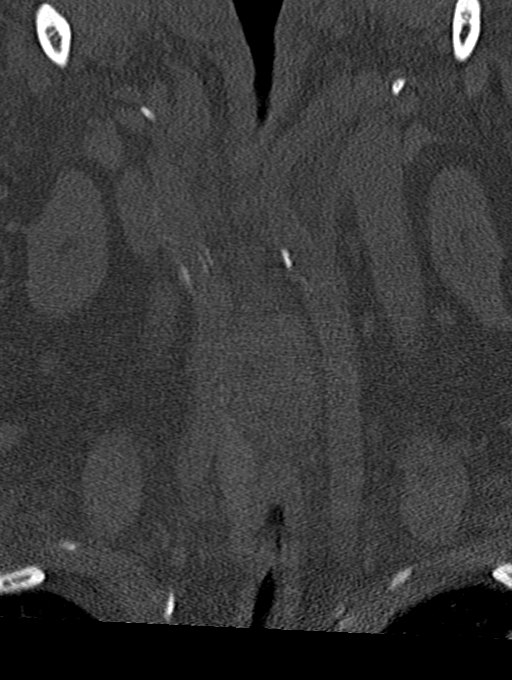
[im 23/56  bone]
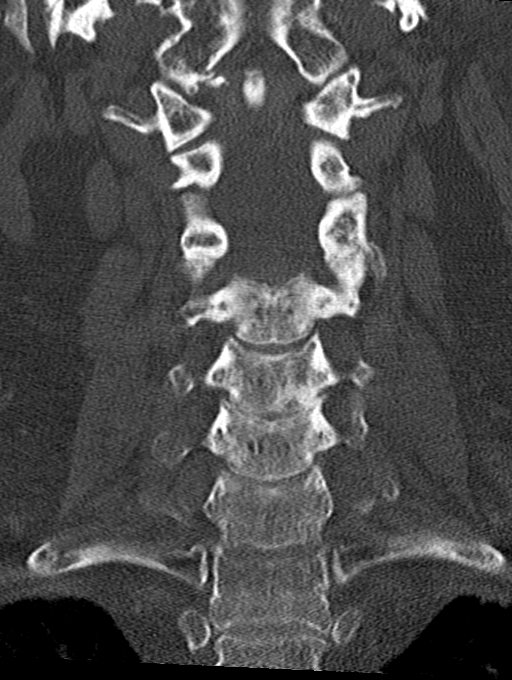
[im 34/56  bone]
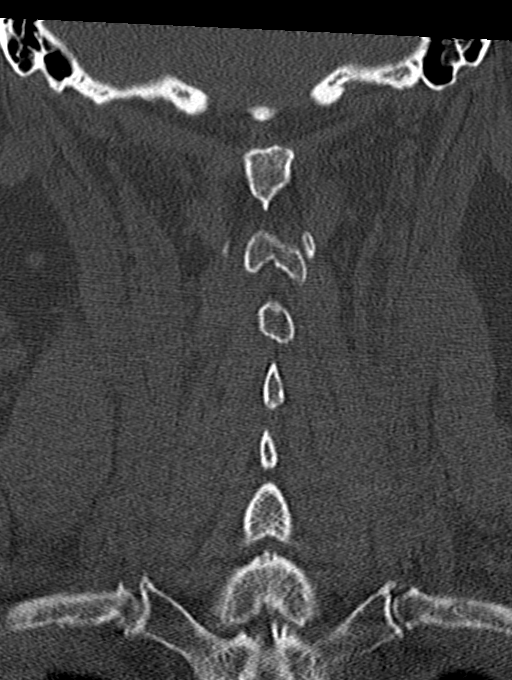

[Series 7: orthogonal axials · axial · 0.21mm/px · z∈[-235,-136]mm · 4 of 82 slices shown, 5 images]
[im 14/82  soft-tissue]
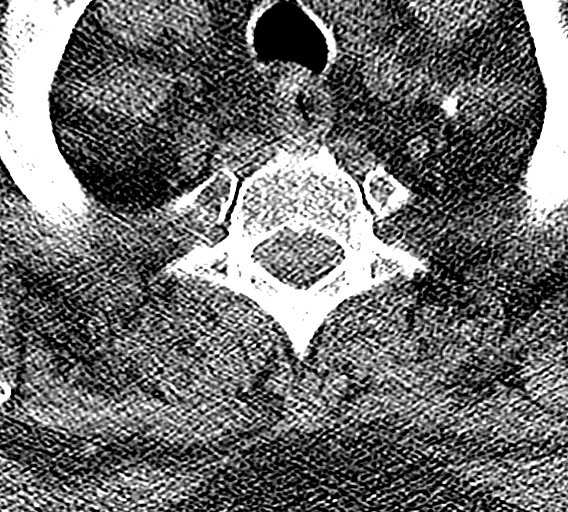
[im 14/82  bone]
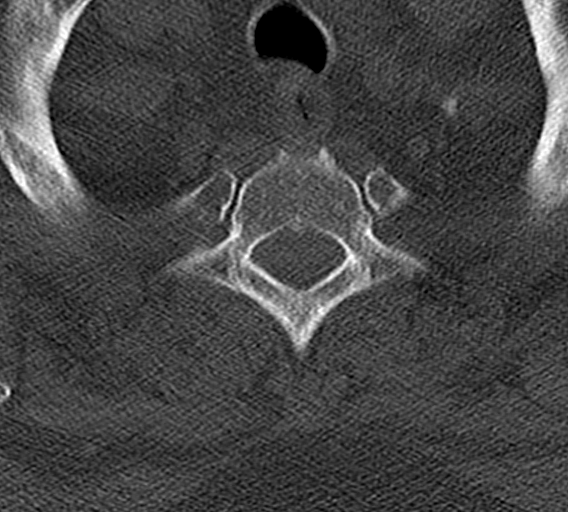
[im 28/82  bone]
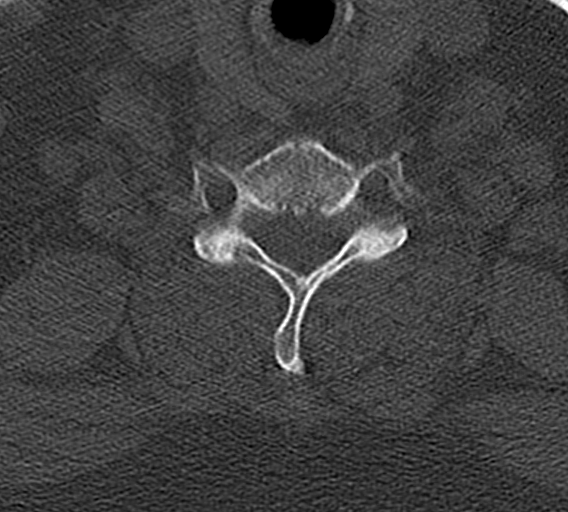
[im 55/82  bone]
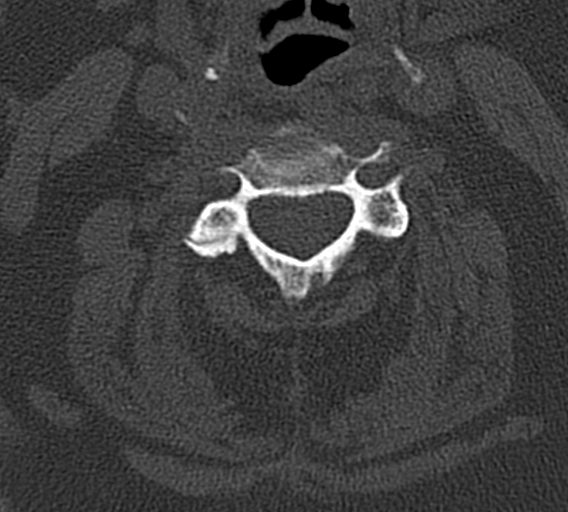
[im 68/82  bone]
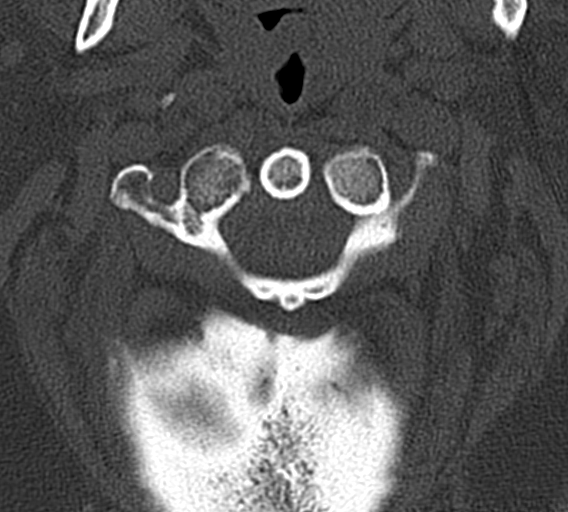

[12 of 33 positions shown; findings below may reference images not displayed]

FINDINGS: CT HEAD FINDINGS

Brain: No acute intracranial hemorrhage. No focal mass lesion. No CT
evidence of acute infarction. No midline shift or mass effect. No
hydrocephalus. Basilar cisterns are patent.

Vascular: No hyperdense vessel or unexpected calcification.

Skull: Normal. Negative for fracture or focal lesion. Incidental
hyperostosis frontalis.

Sinuses/Orbits: Paranasal sinuses and mastoid air cells are clear.
Orbits are clear.

Other: Shallow scalp hematoma over the RIGHT frontal bone.

CT MAXILLOFACIAL FINDINGS

Osseous: Orbital rims are intact. Maxillary sinus walls are intact.
Pterygoid plates are normal. Segment arches are normal. Mandibular
condyles are located. No mandibular fracture.

Orbits: No proptosis. Globes intact. Intraconal contents are normal.
Mild swelling the preseptal soft tissue over the RIGHT eye.

Sinuses: No fluid in the paranasal sinuses

Soft tissues: . small RIGHT frontal scalp hematoma

CT CERVICAL SPINE FINDINGS

Alignment: Normal alignment of the cervical vertebral bodies.

Skull base and vertebrae: Normal craniocervical junction. No loss of
vertebral body height or disc height. Normal facet articulation. No
evidence of fracture.

Soft tissues and spinal canal: No prevertebral soft tissue swelling.
No perispinal or epidural hematoma.

Disc levels:  Unremarkable

Upper chest: Clear

Other: None
IMPRESSION: 1. No intracranial trauma.
2. Shallow RIGHT frontal scalp hematoma.  No skull fracture
3. Mild soft tissue preseptal swelling on the RIGHT. No orbital
injury.
4. No facial bone fracture.
5. No cervical spine fracture

## 2022-02-28 IMAGING — CT CT MAXILLOFACIAL W/O CM
3 series · 15 of 47 positions shown, 18 images · non-contrast
Comparison: None.

CLINICAL DATA: Patient tripped on curb getting out of her car
today. Cut right side of forehead. Abrasions below right eye and
both knees.Facial trauma

EXAM:
CT HEAD WITHOUT CONTRAST
CT MAXILLOFACIAL WITHOUT CONTRAST
CT CERVICAL SPINE WITHOUT CONTRAST
TECHNIQUE: Multidetector CT imaging of the head, cervical spine, and
maxillofacial structures were performed using the standard protocol
without intravenous contrast. Multiplanar CT image reconstructions
of the cervical spine and maxillofacial structures were also
generated.

[Series 2: max soft · axial · 0.38mm/px · z∈[-162,-28]mm · 9 of 79 slices shown, 12 images]
[im 6/79  brain]
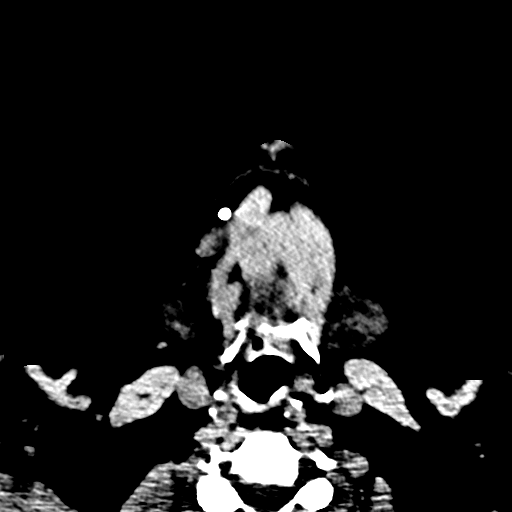
[im 6/79  bone]
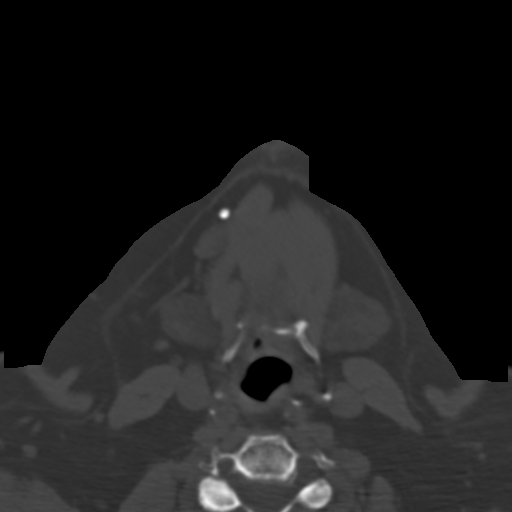
[im 14/79  bone]
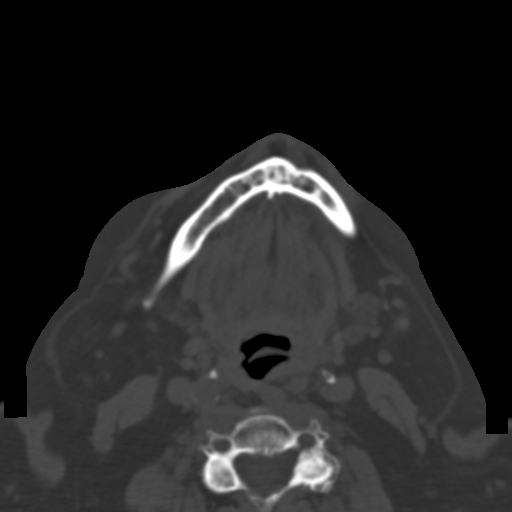
[im 22/79  bone]
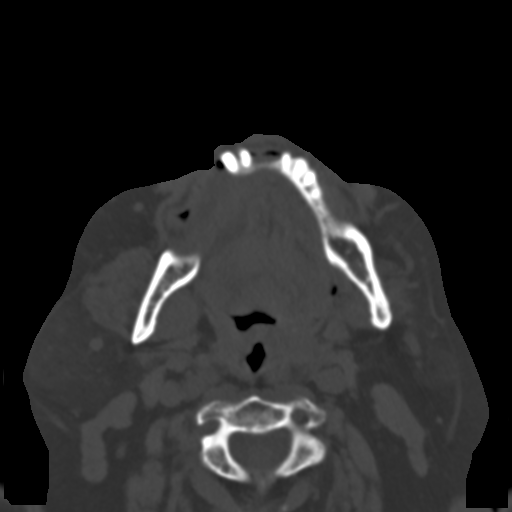
[im 30/79  bone]
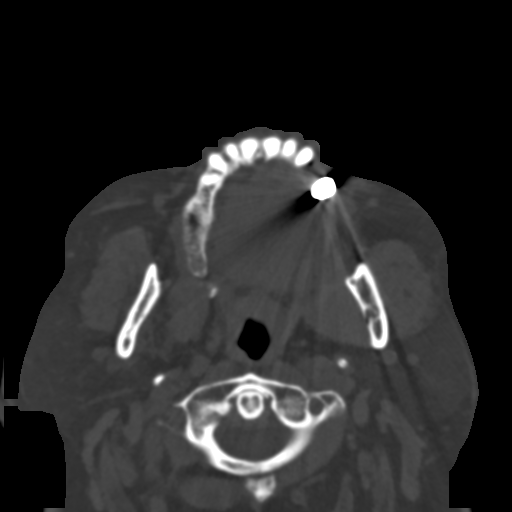
[im 41/79  brain]
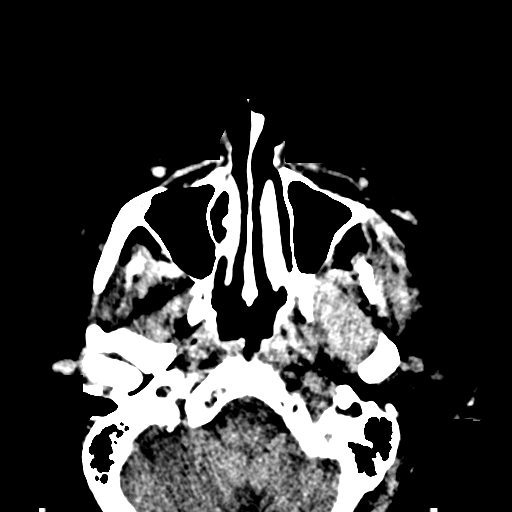
[im 41/79  bone]
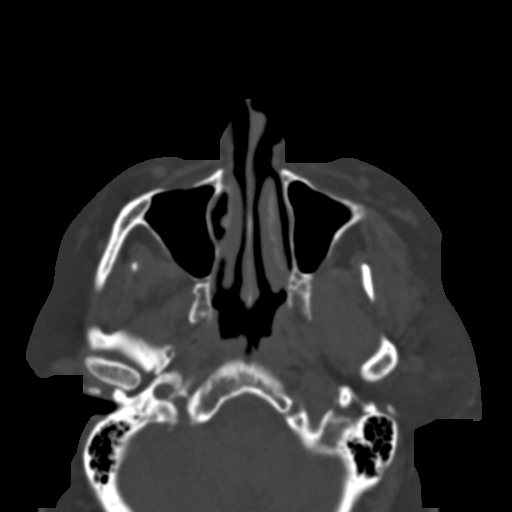
[im 49/79  bone]
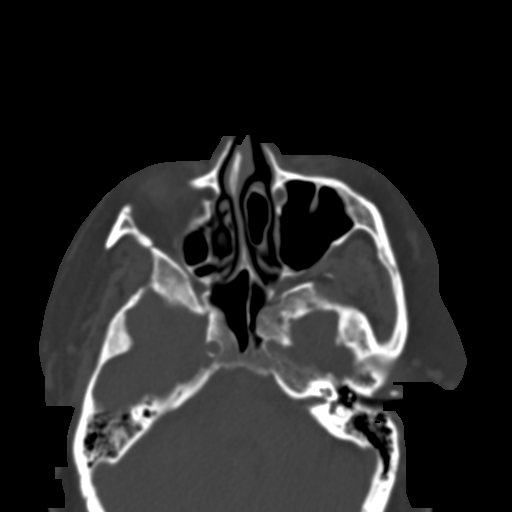
[im 57/79  bone]
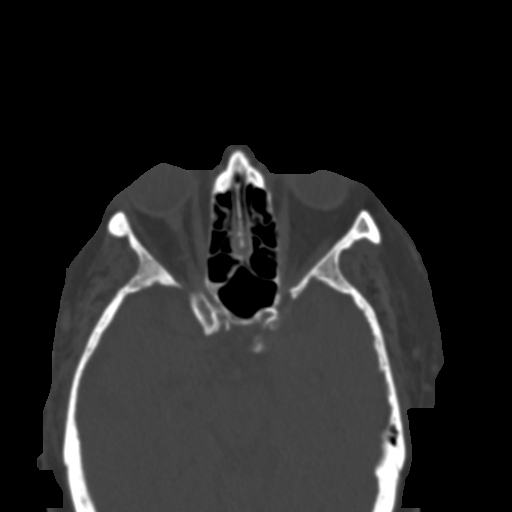
[im 65/79  bone]
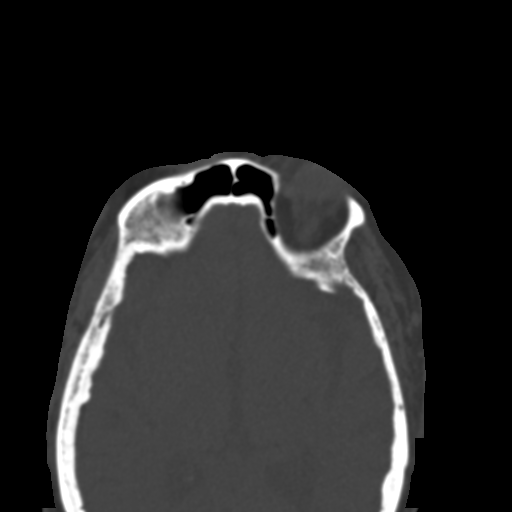
[im 73/79  brain]
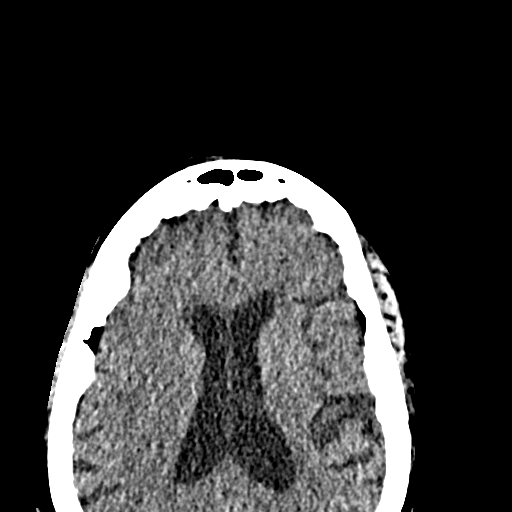
[im 73/79  bone]
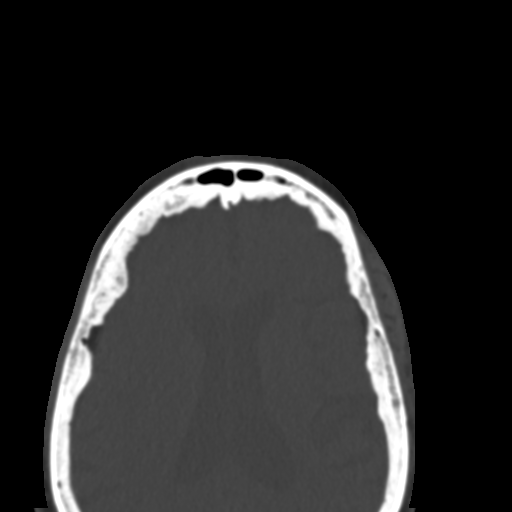

[Series 6: coronal soft · coronal · 0.32mm/px · 3 of 81 slices shown]
[im 27/81  bone]
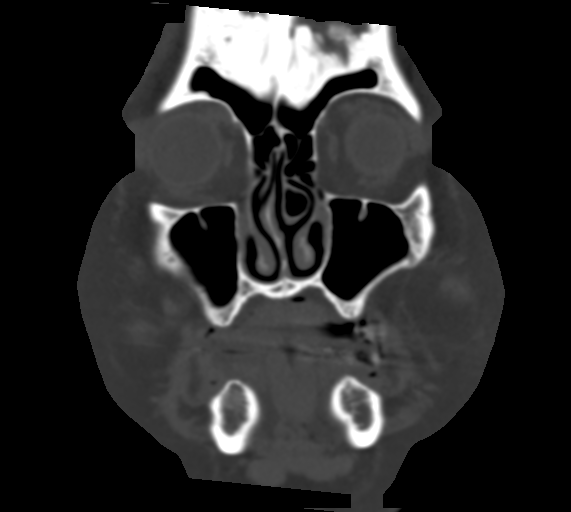
[im 36/81  bone]
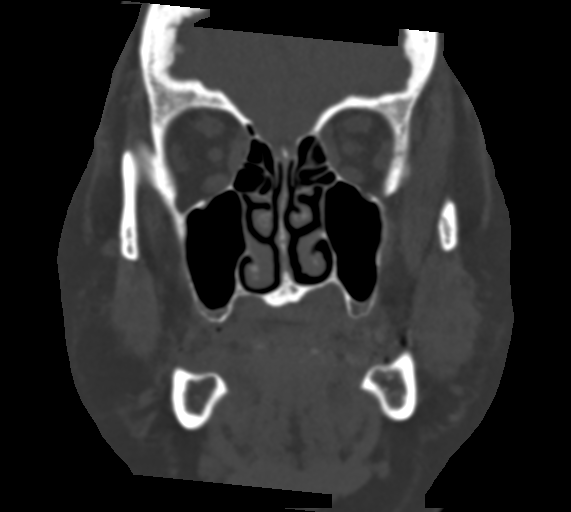
[im 45/81  bone]
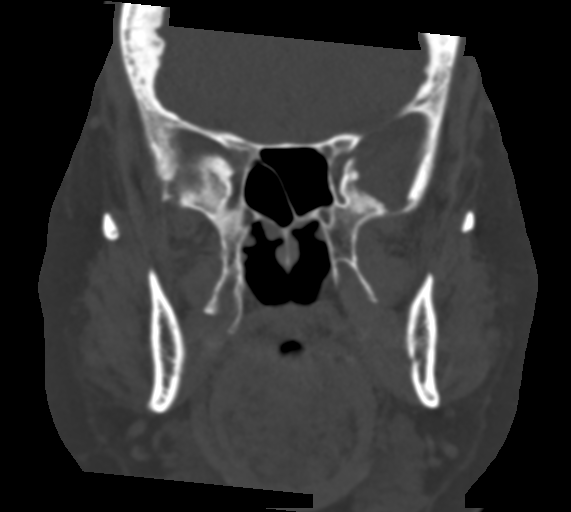

[Series 7: sagittal soft · sagittal · 0.31mm/px · 3 of 92 slices shown]
[im 31/92  bone]
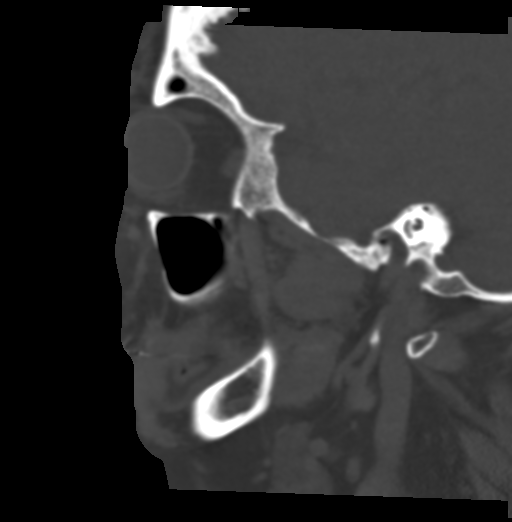
[im 46/92  bone]
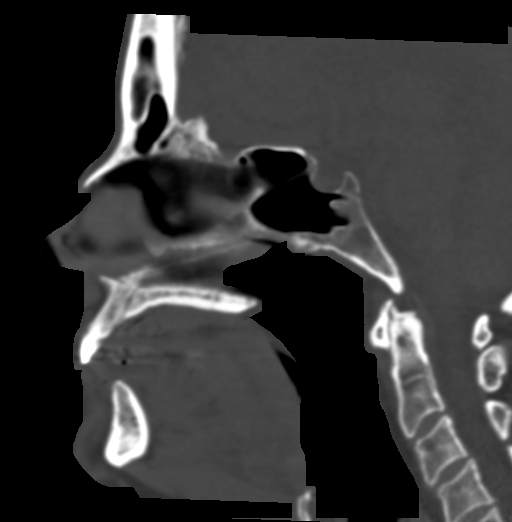
[im 61/92  bone]
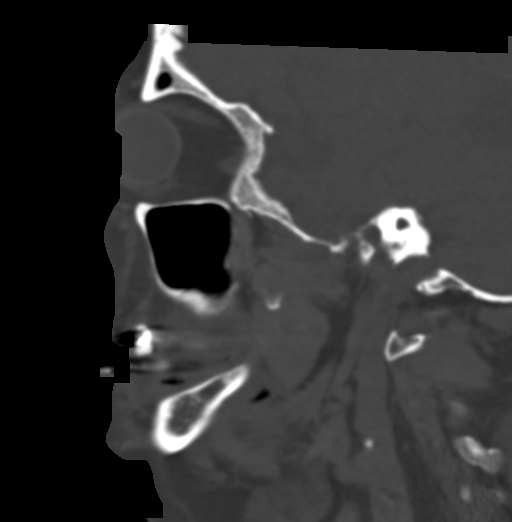

[15 of 47 positions shown; findings below may reference images not displayed]

FINDINGS: CT HEAD FINDINGS

Brain: No acute intracranial hemorrhage. No focal mass lesion. No CT
evidence of acute infarction. No midline shift or mass effect. No
hydrocephalus. Basilar cisterns are patent.

Vascular: No hyperdense vessel or unexpected calcification.

Skull: Normal. Negative for fracture or focal lesion. Incidental
hyperostosis frontalis.

Sinuses/Orbits: Paranasal sinuses and mastoid air cells are clear.
Orbits are clear.

Other: Shallow scalp hematoma over the RIGHT frontal bone.

CT MAXILLOFACIAL FINDINGS

Osseous: Orbital rims are intact. Maxillary sinus walls are intact.
Pterygoid plates are normal. Segment arches are normal. Mandibular
condyles are located. No mandibular fracture.

Orbits: No proptosis. Globes intact. Intraconal contents are normal.
Mild swelling the preseptal soft tissue over the RIGHT eye.

Sinuses: No fluid in the paranasal sinuses

Soft tissues: . small RIGHT frontal scalp hematoma

CT CERVICAL SPINE FINDINGS

Alignment: Normal alignment of the cervical vertebral bodies.

Skull base and vertebrae: Normal craniocervical junction. No loss of
vertebral body height or disc height. Normal facet articulation. No
evidence of fracture.

Soft tissues and spinal canal: No prevertebral soft tissue swelling.
No perispinal or epidural hematoma.

Disc levels:  Unremarkable

Upper chest: Clear

Other: None
IMPRESSION: 1. No intracranial trauma.
2. Shallow RIGHT frontal scalp hematoma.  No skull fracture
3. Mild soft tissue preseptal swelling on the RIGHT. No orbital
injury.
4. No facial bone fracture.
5. No cervical spine fracture

## 2022-06-12 ENCOUNTER — Ambulatory Visit (INDEPENDENT_AMBULATORY_CARE_PROVIDER_SITE_OTHER): Payer: Medicare Other | Admitting: Internal Medicine

## 2022-06-12 VITALS — BP 150/66 | HR 76 | Resp 16 | Ht 62.0 in | Wt 218.0 lb

## 2022-06-12 DIAGNOSIS — Z7189 Other specified counseling: Secondary | ICD-10-CM | POA: Diagnosis not present

## 2022-06-12 DIAGNOSIS — I4891 Unspecified atrial fibrillation: Secondary | ICD-10-CM | POA: Diagnosis not present

## 2022-06-12 DIAGNOSIS — G4733 Obstructive sleep apnea (adult) (pediatric): Secondary | ICD-10-CM

## 2022-06-12 DIAGNOSIS — Z6841 Body Mass Index (BMI) 40.0 and over, adult: Secondary | ICD-10-CM

## 2022-06-12 DIAGNOSIS — Z9989 Dependence on other enabling machines and devices: Secondary | ICD-10-CM

## 2022-06-12 NOTE — Progress Notes (Unsigned)
[facility] [address] [place], [place] [address]  Pulmonary Sleep Medicine   Office Visit Note  Patient Name: Sheila Bush DOB: Apr 07, 1951 MRN [id number]    Chief Complaint: Obstructive Sleep Apnea visit  Brief History:  Sheila Bush is seen today for an annual follow up on CPAP at 10 cmh20.  The patient has a 6 year history of sleep apnea. Patient is using PAP nightly.  The patient feels rested after sleeping with PAP.  The patient reports benefiting from PAP use. Reported sleepiness is improved and the Epworth Sleepiness Score is 1 out of 24. The patient does not take naps. The patient complains of the following: Some discomfort on the bridge of her nose with her current mask, she wants to switch back to her previous full face mask.  The compliance download shows 99% compliance with an average use time of 8 hours 51 minutes. The AHI is 1.7.  The patient does not complain of limb movements disrupting sleep.  ROS  General: (-) fever, (-) chills, (-) night sweat Nose and Sinuses: (-) nasal stuffiness or itchiness, (-) postnasal drip, (-) nosebleeds, (-) sinus trouble. Mouth and Throat: (-) sore throat, (-) hoarseness. Neck: (-) swollen glands, (-) enlarged thyroid, (-) neck pain. Respiratory: - cough, - shortness of breath, - wheezing. Neurologic: - numbness, - tingling. Psychiatric: - anxiety, - depression   Current Medication: Outpatient Encounter Medications as of 06/12/2022  Medication Sig   amLODipine (NORVASC) 5 MG tablet Take 1 tablet by mouth at bedtime.   atorvastatin (LIPITOR) 40 MG tablet Take 1 tablet by mouth at bedtime.   dronedarone (MULTAQ) 400 MG tablet Take by mouth.   glipiZIDE (GLUCOTROL) 5 MG tablet Take by mouth.   metFORMIN (GLUCOPHAGE) 1000 MG tablet Take by mouth.   metoprolol succinate (TOPROL-XL) 100 MG 24 hr tablet Take by mouth.   Acetaminophen 500 MG capsule Take by mouth.   albuterol (VENTOLIN HFA) 108 (90 Base) MCG/ACT inhaler Inhale  into the lungs.   amLODipine (NORVASC) 5 MG tablet Take by mouth.   atorvastatin (LIPITOR) 40 MG tablet Take by mouth.   benazepril-hydrochlorthiazide (LOTENSIN HCT) 20-25 MG tablet Take 1 tablet by mouth 2 (two) times daily.   Calcium Carb-Cholecalciferol (OYSTER SHELL CALCIUM) 500-400 MG-UNIT TABS Take by mouth.   Cholecalciferol 50 MCG (2000 UT) TABS Take by mouth.   Cholecalciferol 50 MCG (2000 UT) TABS Take by mouth.   cyanocobalamin 1000 MCG tablet Take by mouth.   ELIQUIS 5 MG TABS tablet Take 5 mg by mouth 2 (two) times daily.   fexofenadine (ALLEGRA) 180 MG tablet Take by mouth.   fluticasone (FLONASE) 50 MCG/ACT nasal spray Place into the nose.   folic acid (FOLVITE) 800 MCG tablet Take by mouth.   furosemide (LASIX) 20 MG tablet Take 10 mg by mouth daily.   insulin glargine (LANTUS) 100 UNIT/ML injection Inject into the skin.   insulin glargine (LANTUS) 100 UNIT/ML injection Inject into the skin.   meclizine (ANTIVERT) 12.5 MG tablet Take by mouth.   metFORMIN (GLUCOPHAGE) 1000 MG tablet Take by mouth.   OZEMPIC, 1 MG/DOSE, 4 MG/3ML SOPN Inject 1 mg into the skin once a week.   Potassium Gluconate 2.5 MEQ TABS Take by mouth.   tiZANidine (ZANAFLEX) 2 MG tablet Take 2 mg by mouth at bedtime.   [DISCONTINUED] amLODipine (NORVASC) 5 MG tablet Take 5 mg by mouth at bedtime.   [DISCONTINUED] apixaban (ELIQUIS) 5 MG TABS tablet Take 1 tablet by mouth 2 (  two) times daily.   [DISCONTINUED] atorvastatin (LIPITOR) 40 MG tablet Take 40 mg by mouth at bedtime.   [DISCONTINUED] dronedarone (MULTAQ) 400 MG tablet Take by mouth.   [DISCONTINUED] dronedarone (MULTAQ) 400 MG tablet Take by mouth.   [DISCONTINUED] glipiZIDE (GLUCOTROL) 5 MG tablet Take by mouth.   [DISCONTINUED] glipiZIDE (GLUCOTROL) 5 MG tablet Take by mouth.   [DISCONTINUED] metFORMIN (GLUCOPHAGE) 1000 MG tablet Take by mouth.   [DISCONTINUED] metoprolol succinate (TOPROL-XL) 100 MG 24 hr tablet Take by mouth.    [DISCONTINUED] metoprolol succinate (TOPROL-XL) 100 MG 24 hr tablet Take by mouth.   [DISCONTINUED] Semaglutide,0.25 or 0.5MG /DOS, (OZEMPIC, 0.25 OR 0.5 MG/DOSE,) 2 MG/1.5ML SOPN Inject into the skin.   [DISCONTINUED] Semaglutide,0.25 or 0.5MG /DOS, (OZEMPIC, 0.25 OR 0.5 MG/DOSE,) 2 MG/1.5ML SOPN Inject into the skin.   No facility-administered encounter medications on file as of 06/12/2022.    Surgical History: Past Surgical History:  Procedure Laterality Date   CESAREAN SECTION     ORIF DISTAL RADIUS FRACTURE      Medical History: Past Medical History:  Diagnosis Date   Atrial fibrillation (HCC)    Coronary artery disease    Diastolic heart failure (HCC)    DM (diabetes mellitus), type 2 (HCC)    Hyperlipidemia    OSA on CPAP    Reactive airway disease     Family History: Non contributory to the present illness  Social History: Social History   Socioeconomic History   Marital status: Married    Spouse name: Not on file   Number of children: Not on file   Years of education: Not on file   Highest education level: Not on file  Occupational History   Not on file  Tobacco Use   Smoking status: Never   Smokeless tobacco: Never  Substance and Sexual Activity   Alcohol use: Never   Drug use: Never   Sexual activity: Not on file  Other Topics Concern   Not on file  Social History Narrative   Not on file   Social Determinants of Health   Financial Resource Strain: Not on file  Food Insecurity: Not on file  Transportation Needs: Not on file  Physical Activity: Not on file  Stress: Not on file  Social Connections: Not on file  Intimate Partner Violence: Not on file    Vital Signs: Blood pressure (!) 150/66, pulse 76, resp. rate 16, height  (1.575 m), weight 218 lb (98.9 kg), SpO2 97 %. Body mass index is 39.87 kg/m.    Examination: General Appearance: The patient is well-developed, well-nourished, and in no distress. Neck Circumference: 44 cm Skin:  Gross inspection of skin unremarkable. Head: normocephalic, no gross deformities. Eyes: no gross deformities noted. ENT: ears appear grossly normal Neurologic: Alert and oriented. No involuntary movements.  STOP BANG RISK ASSESSMENT S (snore) Have you been told that you snore?     NO   T (tired) Are you often tired, fatigued, or sleepy during the day?   NO  O (obstruction) Do you stop breathing, choke, or gasp during sleep? NO   P (pressure) Do you have or are you being treated for high blood pressure? YES   B (BMI) Is your body index greater than 35 kg/m? YES   A (age) Are you 84 years old or older? YES   N (neck) Do you have a neck circumference greater than 16 inches?   YES   G (gender) Are you a female? NO   TOTAL STOP/BANG "  YES" ANSWERS 4       A STOP-Bang score of 2 or less is considered low risk, and a score of 5 or more is high risk for having either moderate or severe OSA. For people who score 3 or 4, doctors may need to perform further assessment to determine how likely they are to have OSA.         EPWORTH SLEEPINESS SCALE:  Scale:  (0)= no chance of dozing; (1)= slight chance of dozing; (2)= moderate chance of dozing; (3)= high chance of dozing  Chance  Situtation    Sitting and reading: 0    Watching TV: 1    Sitting Inactive in public: 0    As a passenger in car: 0      Lying down to rest: 0    Sitting and talking: 0    Sitting quielty after lunch: 0    In a car, stopped in traffic: 0   TOTAL SCORE:   1 out of 24    SLEEP STUDIES:  SPLIT (06/19/16)  AHI 39, SPO2 88%, CPAP at 7 cmh20   CPAP COMPLIANCE DATA:  Date Range: 06/12/21 - 06/11/22  Average Daily Use: 8 hours 51 minutes  Median Use: 9 hours 2 minutes  Compliance for > 4 Hours: 361 days  AHI: 1.7 respiratory events per hour  Days Used: 361/365  Mask Leak: 5.1  95th Percentile Pressure: 10 cmh20         LABS: No results found for this or any previous visit (from  the past 2160 hour(s)).  Radiology: DG Knee Complete 4 Views Right  Result Date: 07/02/2020 CLINICAL DATA:  Left knee pain after fall. EXAM: RIGHT KNEE - COMPLETE 4+ VIEW COMPARISON:  None. FINDINGS: No evidence of fracture, dislocation, or joint effusion. No evidence of arthropathy or other focal bone abnormality. Vascular calcifications are noted. IMPRESSION: Negative. Electronically Signed   By: Lupita RaiderJames  Green Jr M.D.   On: 07/02/2020 13:47   CT Head Wo Contrast  Result Date: 07/02/2020 CLINICAL DATA:  Patient tripped on curb getting out of her car today. Cut right side of forehead. Abrasions below right eye and both knees.Facial trauma EXAM: CT HEAD WITHOUT CONTRAST CT MAXILLOFACIAL WITHOUT CONTRAST CT CERVICAL SPINE WITHOUT CONTRAST TECHNIQUE: Multidetector CT imaging of the head, cervical spine, and maxillofacial structures were performed using the standard protocol without intravenous contrast. Multiplanar CT image reconstructions of the cervical spine and maxillofacial structures were also generated. COMPARISON:  None. FINDINGS: CT HEAD FINDINGS Brain: No acute intracranial hemorrhage. No focal mass lesion. No CT evidence of acute infarction. No midline shift or mass effect. No hydrocephalus. Basilar cisterns are patent. Vascular: No hyperdense vessel or unexpected calcification. Skull: Normal. Negative for fracture or focal lesion. Incidental hyperostosis frontalis. Sinuses/Orbits: Paranasal sinuses and mastoid air cells are clear. Orbits are clear. Other: Shallow scalp hematoma over the RIGHT frontal bone. CT MAXILLOFACIAL FINDINGS Osseous: Orbital rims are intact. Maxillary sinus walls are intact. Pterygoid plates are normal. Segment arches are normal. Mandibular condyles are located. No mandibular fracture. Orbits: No proptosis. Globes intact. Intraconal contents are normal. Mild swelling the preseptal soft tissue over the RIGHT eye. Sinuses: No fluid in the paranasal sinuses Soft tissues: . small  RIGHT frontal scalp hematoma CT CERVICAL SPINE FINDINGS Alignment: Normal alignment of the cervical vertebral bodies. Skull base and vertebrae: Normal craniocervical junction. No loss of vertebral body height or disc height. Normal facet articulation. No evidence of fracture. Soft tissues and spinal canal: No  prevertebral soft tissue swelling. No perispinal or epidural hematoma. Disc levels:  Unremarkable Upper chest: Clear Other: None IMPRESSION: 1. No intracranial trauma. 2. Shallow RIGHT frontal scalp hematoma.  No skull fracture 3. Mild soft tissue preseptal swelling on the RIGHT. No orbital injury. 4. No facial bone fracture. 5. No cervical spine fracture Electronically Signed   By: Genevive Bi M.D.   On: 07/02/2020 13:41   CT Cervical Spine Wo Contrast  Result Date: 07/02/2020 CLINICAL DATA:  Patient tripped on curb getting out of her car today. Cut right side of forehead. Abrasions below right eye and both knees.Facial trauma EXAM: CT HEAD WITHOUT CONTRAST CT MAXILLOFACIAL WITHOUT CONTRAST CT CERVICAL SPINE WITHOUT CONTRAST TECHNIQUE: Multidetector CT imaging of the head, cervical spine, and maxillofacial structures were performed using the standard protocol without intravenous contrast. Multiplanar CT image reconstructions of the cervical spine and maxillofacial structures were also generated. COMPARISON:  None. FINDINGS: CT HEAD FINDINGS Brain: No acute intracranial hemorrhage. No focal mass lesion. No CT evidence of acute infarction. No midline shift or mass effect. No hydrocephalus. Basilar cisterns are patent. Vascular: No hyperdense vessel or unexpected calcification. Skull: Normal. Negative for fracture or focal lesion. Incidental hyperostosis frontalis. Sinuses/Orbits: Paranasal sinuses and mastoid air cells are clear. Orbits are clear. Other: Shallow scalp hematoma over the RIGHT frontal bone. CT MAXILLOFACIAL FINDINGS Osseous: Orbital rims are intact. Maxillary sinus walls are intact.  Pterygoid plates are normal. Segment arches are normal. Mandibular condyles are located. No mandibular fracture. Orbits: No proptosis. Globes intact. Intraconal contents are normal. Mild swelling the preseptal soft tissue over the RIGHT eye. Sinuses: No fluid in the paranasal sinuses Soft tissues: . small RIGHT frontal scalp hematoma CT CERVICAL SPINE FINDINGS Alignment: Normal alignment of the cervical vertebral bodies. Skull base and vertebrae: Normal craniocervical junction. No loss of vertebral body height or disc height. Normal facet articulation. No evidence of fracture. Soft tissues and spinal canal: No prevertebral soft tissue swelling. No perispinal or epidural hematoma. Disc levels:  Unremarkable Upper chest: Clear Other: None IMPRESSION: 1. No intracranial trauma. 2. Shallow RIGHT frontal scalp hematoma.  No skull fracture 3. Mild soft tissue preseptal swelling on the RIGHT. No orbital injury. 4. No facial bone fracture. 5. No cervical spine fracture Electronically Signed   By: Genevive Bi M.D.   On: 07/02/2020 13:41   CT Maxillofacial Wo Contrast  Result Date: 07/02/2020 CLINICAL DATA:  Patient tripped on curb getting out of her car today. Cut right side of forehead. Abrasions below right eye and both knees.Facial trauma EXAM: CT HEAD WITHOUT CONTRAST CT MAXILLOFACIAL WITHOUT CONTRAST CT CERVICAL SPINE WITHOUT CONTRAST TECHNIQUE: Multidetector CT imaging of the head, cervical spine, and maxillofacial structures were performed using the standard protocol without intravenous contrast. Multiplanar CT image reconstructions of the cervical spine and maxillofacial structures were also generated. COMPARISON:  None. FINDINGS: CT HEAD FINDINGS Brain: No acute intracranial hemorrhage. No focal mass lesion. No CT evidence of acute infarction. No midline shift or mass effect. No hydrocephalus. Basilar cisterns are patent. Vascular: No hyperdense vessel or unexpected calcification. Skull: Normal. Negative  for fracture or focal lesion. Incidental hyperostosis frontalis. Sinuses/Orbits: Paranasal sinuses and mastoid air cells are clear. Orbits are clear. Other: Shallow scalp hematoma over the RIGHT frontal bone. CT MAXILLOFACIAL FINDINGS Osseous: Orbital rims are intact. Maxillary sinus walls are intact. Pterygoid plates are normal. Segment arches are normal. Mandibular condyles are located. No mandibular fracture. Orbits: No proptosis. Globes intact. Intraconal contents are normal. Mild swelling the  preseptal soft tissue over the RIGHT eye. Sinuses: No fluid in the paranasal sinuses Soft tissues: . small RIGHT frontal scalp hematoma CT CERVICAL SPINE FINDINGS Alignment: Normal alignment of the cervical vertebral bodies. Skull base and vertebrae: Normal craniocervical junction. No loss of vertebral body height or disc height. Normal facet articulation. No evidence of fracture. Soft tissues and spinal canal: No prevertebral soft tissue swelling. No perispinal or epidural hematoma. Disc levels:  Unremarkable Upper chest: Clear Other: None IMPRESSION: 1. No intracranial trauma. 2. Shallow RIGHT frontal scalp hematoma.  No skull fracture 3. Mild soft tissue preseptal swelling on the RIGHT. No orbital injury. 4. No facial bone fracture. 5. No cervical spine fracture Electronically Signed   By: Genevive Bi M.D.   On: 07/02/2020 13:41    No results found.  No results found.    Assessment and Plan: Patient Active Problem List   Diagnosis Date Noted   Diastolic heart failure (HCC) 06/13/2021   OSA on CPAP 06/13/2021   CPAP use counseling 06/13/2021   Atrial fibrillation (HCC) 06/13/2021   Atrial fibrillation, new onset (HCC) 04/30/2019   Beta thalassemia trait 10/09/2018   Type 2 diabetes mellitus with microalbuminuria, with long-term current use of insulin (HCC) 10/09/2018   Obstructive sleep apnea 06/26/2016   Morbid obesity with BMI of 40.0-44.9, adult (HCC) 09/10/2014   Coronary artery disease  involving native coronary artery of native heart with angina pectoris (HCC) 08/20/2014   RAD (reactive airway disease) 04/16/2012   Low back pain with sciatica 04/28/2011   Essential hypertension 04/17/2011    1. OSA on CPAP The patient does tolerate PAP and reports  benefit from PAP use. The patient was reminded how to clean equipment and advised to replace supplies routinely. The patient was also counselled on weight loss. The compliance is excellent. The AHI is 1.7.   OSA on cpap-  CPAP continues to be medically necessary to treat this patient's OSA. Machine is at end of life and must be replaced. F/u 30d after set up.  Continue with excellent compliance with pap.   2. CPAP use counseling CPAP Counseling: had a lengthy discussion with the patient regarding the importance of PAP therapy in management of the sleep apnea. Patient appears to understand the risk factor reduction and also understands the risks associated with untreated sleep apnea. Patient will try to make a good faith effort to remain compliant with therapy. Also instructed the patient on proper cleaning of the device including the water must be changed daily if possible and use of distilled water is preferred. Patient understands that the machine should be regularly cleaned with appropriate recommended cleaning solutions that do not damage the PAP machine for example given white vinegar and water rinses. Other methods such as ozone treatment may not be as good as these simple methods to achieve cleaning.   3. Atrial fibrillation, unspecified type (HCC) On eliquis. Stable.   4. Morbid obesity with BMI of 40.0-44.9, adult (HCC) Obesity Counseling: Had a lengthy discussion regarding patients BMI and weight issues. Patient was instructed on portion control as well as increased activity. Also discussed caloric restrictions with trying to maintain intake less than 2000 Kcal. Discussions were made in accordance with the 5As of weight  management. Simple actions such as not eating late and if able to, taking a walk is suggested.       General Counseling: I have discussed the findings of the evaluation and examination with Dacey.  I have also discussed any further  diagnostic evaluation thatmay be needed or ordered today. [place] verbalizes understanding of the findings of todays visit. We also reviewed her medications today and discussed drug interactions and side effects including but not limited excessive drowsiness and altered mental states. We also discussed that there is always a risk not just to her but also people around her. she has been encouraged to call the office with any questions or concerns that should arise related to todays visit.  No orders of the defined types were placed in this encounter.       I have personally obtained a history, examined the patient, evaluated laboratory and imaging results, formulated the assessment and plan and placed orders. This patient was seen today by Emmaline Kluver, PA-C in collaboration with Dr. Freda Munro.   Yevonne Pax, MD [facility] Diplomate ABMS Pulmonary Critical Care Medicine and Sleep Medicine

## 2022-06-12 NOTE — Patient Instructions (Signed)

## 2022-11-13 ENCOUNTER — Ambulatory Visit (INDEPENDENT_AMBULATORY_CARE_PROVIDER_SITE_OTHER): Payer: Medicare Other | Admitting: Internal Medicine

## 2022-11-13 VITALS — BP 132/55 | HR 78 | Resp 16 | Ht 62.0 in | Wt 215.0 lb

## 2022-11-13 DIAGNOSIS — E669 Obesity, unspecified: Secondary | ICD-10-CM | POA: Insufficient documentation

## 2022-11-13 DIAGNOSIS — Z7189 Other specified counseling: Secondary | ICD-10-CM | POA: Diagnosis not present

## 2022-11-13 DIAGNOSIS — I4891 Unspecified atrial fibrillation: Secondary | ICD-10-CM

## 2022-11-13 DIAGNOSIS — G4733 Obstructive sleep apnea (adult) (pediatric): Secondary | ICD-10-CM

## 2022-11-13 DIAGNOSIS — I5189 Other ill-defined heart diseases: Secondary | ICD-10-CM | POA: Insufficient documentation

## 2022-11-13 NOTE — Patient Instructions (Signed)

## 2022-11-13 NOTE — Progress Notes (Signed)
[facility] [place], [place] [address]  Pulmonary Sleep Medicine   Office Visit Note  Patient Name: Sheila Bush DOB: [birth year] MRN [id number]    Chief Complaint: Obstructive Sleep Apnea visit  Brief History:  Sheila Bush is seen today for a 90 day follow up after new setup on CPAP at 10 cmh20. The patient has a 6 year history of sleep apnea. Patient is using PAP nightly.  The patient feels rested after sleeping with PAP.  The patient reports benefiting from PAP use. Reported sleepiness is improved and the Epworth Sleepiness Score is 1 out of 24. The patient does not take naps. The patient complains of the following: No complaints.  The compliance download shows 100% compliance with an average use time of 8 hours 55 minutes. The AHI is 1.5.  The patient does not complain of limb movements disrupting sleep.  ROS  General: (-) fever, (-) chills, (-) night sweat Nose and Sinuses: (-) nasal stuffiness or itchiness, (-) postnasal drip, (-) nosebleeds, (-) sinus trouble. Mouth and Throat: (-) sore throat, (-) hoarseness. Neck: (-) swollen glands, (-) enlarged thyroid, (-) neck pain. Respiratory: - cough, - shortness of breath, - wheezing. Neurologic: - numbness, - tingling. Psychiatric: - anxiety, - depression   Current Medication: Outpatient Encounter Medications as of 11/13/2022  Medication Sig   Acetaminophen 500 MG capsule Take by mouth.   albuterol (VENTOLIN HFA) 108 (90 Base) MCG/ACT inhaler Inhale into the lungs.   amLODipine (NORVASC) 5 MG tablet Take 1 tablet by mouth at bedtime.   atorvastatin (LIPITOR) 40 MG tablet Take 1 tablet by mouth at bedtime.   benazepril-hydrochlorthiazide (LOTENSIN HCT) 20-25 MG tablet Take 1 tablet by mouth 2 (two) times daily.   Calcium Carb-Cholecalciferol (OYSTER SHELL CALCIUM) 500-400 MG-UNIT TABS Take by mouth.   Cholecalciferol 50 MCG (2000 UT) TABS Take by mouth.   Cholecalciferol 50 MCG (2000 UT) TABS Take by mouth.    cyanocobalamin 1000 MCG tablet Take by mouth.   dronedarone (MULTAQ) 400 MG tablet Take by mouth.   ELIQUIS 5 MG TABS tablet Take 5 mg by mouth 2 (two) times daily.   FARXIGA 10 MG TABS tablet Take 10 mg by mouth daily.   fexofenadine (ALLEGRA) 180 MG tablet Take by mouth.   fluticasone (FLONASE) 50 MCG/ACT nasal spray Place into the nose.   folic acid (FOLVITE) [id number] MCG tablet Take by mouth.   furosemide (LASIX) 20 MG tablet Take 10 mg by mouth daily.   glipiZIDE (GLUCOTROL) 5 MG tablet Take by mouth.   insulin glargine (LANTUS) 100 UNIT/ML injection Inject into the skin.   meclizine (ANTIVERT) 12.5 MG tablet Take by mouth.   metFORMIN (GLUCOPHAGE) 1000 MG tablet Take by mouth.   metoprolol succinate (TOPROL-XL) 100 MG 24 hr tablet Take by mouth.   OZEMPIC, 1 MG/DOSE, 4 MG/3ML SOPN Inject 1 mg into the skin once a week.   Potassium Gluconate 2.5 MEQ TABS Take by mouth.   tiZANidine (ZANAFLEX) 2 MG tablet Take 2 mg by mouth at bedtime.   [DISCONTINUED] amLODipine (NORVASC) 5 MG tablet Take by mouth.   [DISCONTINUED] atorvastatin (LIPITOR) 40 MG tablet Take by mouth.   [DISCONTINUED] insulin glargine (LANTUS) 100 UNIT/ML injection Inject into the skin.   [DISCONTINUED] metFORMIN (GLUCOPHAGE) 1000 MG tablet Take by mouth.   No facility-administered encounter medications on file as of 11/13/2022.    Surgical History: Past Surgical History:  Procedure Laterality Date   CESAREAN SECTION  ORIF DISTAL RADIUS FRACTURE      Medical History: Past Medical History:  Diagnosis Date   Atrial fibrillation (HCC)    Coronary artery disease    Diastolic heart failure (HCC)    DM (diabetes mellitus), type 2 (HCC)    Hyperlipidemia    OSA on CPAP    Reactive airway disease     Family History: Non contributory to the present illness  Social History: Social History   Socioeconomic History   Marital status: Married    Spouse name: Not on file   Number of children: Not on file    Years of education: Not on file   Highest education level: Not on file  Occupational History   Not on file  Tobacco Use   Smoking status: Never   Smokeless tobacco: Never  Substance and Sexual Activity   Alcohol use: Never   Drug use: Never   Sexual activity: Not on file  Other Topics Concern   Not on file  Social History Narrative   Not on file   Social Determinants of Health   Financial Resource Strain: Not on file  Food Insecurity: Not on file  Transportation Needs: Not on file  Physical Activity: Not on file  Stress: Not on file  Social Connections: Not on file  Intimate Partner Violence: Not on file    Vital Signs: Blood pressure (!) 132/55, pulse 78, resp. rate 16, height 5' 2"$  (1.575 m), weight 215 lb (97.5 kg), SpO2 96 %. Body mass index is 39.32 kg/m.    Examination: General Appearance: The patient is well-developed, well-nourished, and in no distress. Neck Circumference: 44 cm Skin: Gross inspection of skin unremarkable. Head: normocephalic, no gross deformities. Eyes: no gross deformities noted. ENT: ears appear grossly normal Neurologic: Alert and oriented. No involuntary movements.  STOP BANG RISK ASSESSMENT S (snore) Have you been told that you snore?     NO   T (tired) Are you often tired, fatigued, or sleepy during the day?   NO  O (obstruction) Do you stop breathing, choke, or gasp during sleep? NO   P (pressure) Do you have or are you being treated for high blood pressure? YES   B (BMI) Is your body index greater than 35 kg/m? YES   A (age) Are you 19 years old or older? YES   N (neck) Do you have a neck circumference greater than 16 inches?   YES   G (gender) Are you a female? NO   TOTAL STOP/BANG "YES" ANSWERS 4       A STOP-Bang score of 2 or less is considered low risk, and a score of 5 or more is high risk for having either moderate or severe OSA. For people who score 3 or 4, doctors may need to perform further assessment to  determine how likely they are to have OSA.         EPWORTH SLEEPINESS SCALE:  Scale:  (0)= no chance of dozing; (1)= slight chance of dozing; (2)= moderate chance of dozing; (3)= high chance of dozing  Chance  Situtation    Sitting and reading: 0    Watching TV: 1    Sitting Inactive in public: 0    As a passenger in car: 0      Lying down to rest: 0    Sitting and talking: 0    Sitting quielty after lunch: 0    In a car, stopped in traffic: 0   TOTAL SCORE:  1 out of 24    SLEEP STUDIES:  SPLIT (06/19/16)  AHI 39, SPO2 88%, CPAP at 7 cmh20.    CPAP COMPLIANCE DATA:  Date Range: 08/08/22 - 11/08/22  Average Daily Use: 8 hours 55 minutes  Median Use: 8 hours 57 minutes  Compliance for > 4 Hours: 93 days  AHI: 1.5 respiratory events per hour  Days Used: 93/93  Mask Leak: 5.5  95th Percentile Pressure: 10 cmh20         LABS: No results found for this or any previous visit (from the past 2160 hour(s)).  Radiology: DG Knee Complete 4 Views Right  Result Date: 07/02/2020 CLINICAL DATA:  Left knee pain after fall. EXAM: RIGHT KNEE - COMPLETE 4+ VIEW COMPARISON:  None. FINDINGS: No evidence of fracture, dislocation, or joint effusion. No evidence of arthropathy or other focal bone abnormality. Vascular calcifications are noted. IMPRESSION: Negative. Electronically Signed   By: Marijo Conception M.D.   On: 07/02/2020 13:47   CT Head Wo Contrast  Result Date: 07/02/2020 CLINICAL DATA:  Patient tripped on curb getting out of her car today. Cut right side of forehead. Abrasions below right eye and both knees.Facial trauma EXAM: CT HEAD WITHOUT CONTRAST CT MAXILLOFACIAL WITHOUT CONTRAST CT CERVICAL SPINE WITHOUT CONTRAST TECHNIQUE: Multidetector CT imaging of the head, cervical spine, and maxillofacial structures were performed using the standard protocol without intravenous contrast. Multiplanar CT image reconstructions of the cervical spine and maxillofacial  structures were also generated. COMPARISON:  None. FINDINGS: CT HEAD FINDINGS Brain: No acute intracranial hemorrhage. No focal mass lesion. No CT evidence of acute infarction. No midline shift or mass effect. No hydrocephalus. Basilar cisterns are patent. Vascular: No hyperdense vessel or unexpected calcification. Skull: Normal. Negative for fracture or focal lesion. Incidental hyperostosis frontalis. Sinuses/Orbits: Paranasal sinuses and mastoid air cells are clear. Orbits are clear. Other: Shallow scalp hematoma over the RIGHT frontal bone. CT MAXILLOFACIAL FINDINGS Osseous: Orbital rims are intact. Maxillary sinus walls are intact. Pterygoid plates are normal. Segment arches are normal. Mandibular condyles are located. No mandibular fracture. Orbits: No proptosis. Globes intact. Intraconal contents are normal. Mild swelling the preseptal soft tissue over the RIGHT eye. Sinuses: No fluid in the paranasal sinuses Soft tissues: . small RIGHT frontal scalp hematoma CT CERVICAL SPINE FINDINGS Alignment: Normal alignment of the cervical vertebral bodies. Skull base and vertebrae: Normal craniocervical junction. No loss of vertebral body height or disc height. Normal facet articulation. No evidence of fracture. Soft tissues and spinal canal: No prevertebral soft tissue swelling. No perispinal or epidural hematoma. Disc levels:  Unremarkable Upper chest: Clear Other: None IMPRESSION: 1. No intracranial trauma. 2. Shallow RIGHT frontal scalp hematoma.  No skull fracture 3. Mild soft tissue preseptal swelling on the RIGHT. No orbital injury. 4. No facial bone fracture. 5. No cervical spine fracture Electronically Signed   By: Suzy Bouchard M.D.   On: 07/02/2020 13:41   CT Cervical Spine Wo Contrast  Result Date: 07/02/2020 CLINICAL DATA:  Patient tripped on curb getting out of her car today. Cut right side of forehead. Abrasions below right eye and both knees.Facial trauma EXAM: CT HEAD WITHOUT CONTRAST CT  MAXILLOFACIAL WITHOUT CONTRAST CT CERVICAL SPINE WITHOUT CONTRAST TECHNIQUE: Multidetector CT imaging of the head, cervical spine, and maxillofacial structures were performed using the standard protocol without intravenous contrast. Multiplanar CT image reconstructions of the cervical spine and maxillofacial structures were also generated. COMPARISON:  None. FINDINGS: CT HEAD FINDINGS Brain: No acute intracranial hemorrhage. No  focal mass lesion. No CT evidence of acute infarction. No midline shift or mass effect. No hydrocephalus. Basilar cisterns are patent. Vascular: No hyperdense vessel or unexpected calcification. Skull: Normal. Negative for fracture or focal lesion. Incidental hyperostosis frontalis. Sinuses/Orbits: Paranasal sinuses and mastoid air cells are clear. Orbits are clear. Other: Shallow scalp hematoma over the RIGHT frontal bone. CT MAXILLOFACIAL FINDINGS Osseous: Orbital rims are intact. Maxillary sinus walls are intact. Pterygoid plates are normal. Segment arches are normal. Mandibular condyles are located. No mandibular fracture. Orbits: No proptosis. Globes intact. Intraconal contents are normal. Mild swelling the preseptal soft tissue over the RIGHT eye. Sinuses: No fluid in the paranasal sinuses Soft tissues: . small RIGHT frontal scalp hematoma CT CERVICAL SPINE FINDINGS Alignment: Normal alignment of the cervical vertebral bodies. Skull base and vertebrae: Normal craniocervical junction. No loss of vertebral body height or disc height. Normal facet articulation. No evidence of fracture. Soft tissues and spinal canal: No prevertebral soft tissue swelling. No perispinal or epidural hematoma. Disc levels:  Unremarkable Upper chest: Clear Other: None IMPRESSION: 1. No intracranial trauma. 2. Shallow RIGHT frontal scalp hematoma.  No skull fracture 3. Mild soft tissue preseptal swelling on the RIGHT. No orbital injury. 4. No facial bone fracture. 5. No cervical spine fracture Electronically  Signed   By: Suzy Bouchard M.D.   On: 07/02/2020 13:41   CT Maxillofacial Wo Contrast  Result Date: 07/02/2020 CLINICAL DATA:  Patient tripped on curb getting out of her car today. Cut right side of forehead. Abrasions below right eye and both knees.Facial trauma EXAM: CT HEAD WITHOUT CONTRAST CT MAXILLOFACIAL WITHOUT CONTRAST CT CERVICAL SPINE WITHOUT CONTRAST TECHNIQUE: Multidetector CT imaging of the head, cervical spine, and maxillofacial structures were performed using the standard protocol without intravenous contrast. Multiplanar CT image reconstructions of the cervical spine and maxillofacial structures were also generated. COMPARISON:  None. FINDINGS: CT HEAD FINDINGS Brain: No acute intracranial hemorrhage. No focal mass lesion. No CT evidence of acute infarction. No midline shift or mass effect. No hydrocephalus. Basilar cisterns are patent. Vascular: No hyperdense vessel or unexpected calcification. Skull: Normal. Negative for fracture or focal lesion. Incidental hyperostosis frontalis. Sinuses/Orbits: Paranasal sinuses and mastoid air cells are clear. Orbits are clear. Other: Shallow scalp hematoma over the RIGHT frontal bone. CT MAXILLOFACIAL FINDINGS Osseous: Orbital rims are intact. Maxillary sinus walls are intact. Pterygoid plates are normal. Segment arches are normal. Mandibular condyles are located. No mandibular fracture. Orbits: No proptosis. Globes intact. Intraconal contents are normal. Mild swelling the preseptal soft tissue over the RIGHT eye. Sinuses: No fluid in the paranasal sinuses Soft tissues: . small RIGHT frontal scalp hematoma CT CERVICAL SPINE FINDINGS Alignment: Normal alignment of the cervical vertebral bodies. Skull base and vertebrae: Normal craniocervical junction. No loss of vertebral body height or disc height. Normal facet articulation. No evidence of fracture. Soft tissues and spinal canal: No prevertebral soft tissue swelling. No perispinal or epidural hematoma.  Disc levels:  Unremarkable Upper chest: Clear Other: None IMPRESSION: 1. No intracranial trauma. 2. Shallow RIGHT frontal scalp hematoma.  No skull fracture 3. Mild soft tissue preseptal swelling on the RIGHT. No orbital injury. 4. No facial bone fracture. 5. No cervical spine fracture Electronically Signed   By: Suzy Bouchard M.D.   On: 07/02/2020 13:41    No results found.  No results found.    Assessment and Plan: Patient Active Problem List   Diagnosis Date Noted   Cardiac mass 11/13/2022   Obesity (BMI 30-39.9) 11/13/2022  Diastolic heart failure ([place]) 06/13/2021   OSA on CPAP 06/13/2021   CPAP use counseling 06/13/2021   Atrial fibrillation ([place]) 06/13/2021   Atrial fibrillation, new onset ([place]) 04/30/2019   Beta thalassemia trait 10/09/2018   Type 2 diabetes mellitus with microalbuminuria, with long-term current use of insulin ([place]) 10/09/2018   Obstructive sleep apnea 06/26/2016   Morbid obesity with BMI of 40.0-44.9, adult ([place]) 09/10/2014   Coronary artery disease involving native coronary artery of native heart with angina pectoris ([place]) 08/20/2014   Leukocytosis 07/21/2014   Peripheral neuropathy 01/15/2013   Low vitamin B12 level 01/15/2013   RAD (reactive airway disease) 04/16/2012   Low back pain with sciatica 04/28/2011   Essential hypertension 04/17/2011   Mixed hyperlipidemia 04/17/2011   1. OSA on CPAP The patient does tolerate PAP and reports  benefit from PAP use. The patient was reminded how to clean equipment and advised to replace supplies routinely. The patient was also counselled on weight loss. The compliance is excellent. The AHI is 1.5.   OSA on cpap- controlled. Continue with excellent compliance with pap. CPAP continues to be medically necessary to treat this patient's OSA. F/u one year.    2. CPAP use counseling CPAP Counseling: had a lengthy discussion with the patient regarding the importance of PAP therapy in management of the sleep apnea.  Patient appears to understand the risk factor reduction and also understands the risks associated with untreated sleep apnea. Patient will try to make a good faith effort to remain compliant with therapy. Also instructed the patient on proper cleaning of the device including the water must be changed daily if possible and use of distilled water is preferred. Patient understands that the machine should be regularly cleaned with appropriate recommended cleaning solutions that do not damage the PAP machine for example given white vinegar and water rinses. Other methods such as ozone treatment may not be as good as these simple methods to achieve cleaning.   3. Atrial fibrillation, unspecified type ([place]) Continue f/u with cardiology.   4. Obesity (BMI 30-39.9) Obesity Counseling: Had a lengthy discussion regarding patients BMI and weight issues. Patient was instructed on portion control as well as increased activity. Also discussed caloric restrictions with trying to maintain intake less than 2000 Kcal. Discussions were made in accordance with the 5As of weight management. Simple actions such as not eating late and if able to, taking a walk is suggested.     General Counseling: I have discussed the findings of the evaluation and examination with Zeyla.  I have also discussed any further diagnostic evaluation thatmay be needed or ordered today. Fatoumata verbalizes understanding of the findings of todays visit. We also reviewed her medications today and discussed drug interactions and side effects including but not limited excessive drowsiness and altered mental states. We also discussed that there is always a risk not just to her but also people around her. she has been encouraged to call the office with any questions or concerns that should arise related to todays visit.  No orders of the defined types were placed in this encounter.       I have personally obtained a history, examined the patient,  evaluated laboratory and imaging results, formulated the assessment and plan and placed orders. This patient was seen today by Tressie Ellis, PA-C in collaboration with Dr. Devona Konig.   Allyne Gee, MD [facility] Diplomate ABMS Pulmonary Critical Care Medicine and Sleep Medicine

## 2023-11-18 NOTE — Progress Notes (Unsigned)
[facility] [address] [place], [place] [address]  Pulmonary Sleep Medicine   Office Visit Note  Patient Name: Sheila Bush DOB: [birth year] MRN [id number]    Chief Complaint: Obstructive Sleep Apnea visit  Brief History:  Sheila Bush is seen today for an annual follow up on CPAP@10cmh20 .  The patient has a 7 year  history of sleep apnea. Patient is using PAP nightly.  The patient feels *** after sleeping with PAP.  The patient reports *** from PAP use. Reported sleepiness is  *** and the Epworth Sleepiness Score is *** out of 24. The patient *** take naps. The patient complains of the following: ***  The compliance download shows 98% compliance with an average use time of 9 hours.12 min The AHI is 2.3  The patient *** of limb movements disrupting sleep.  ROS  General: (-) fever, (-) chills, (-) night sweat Nose and Sinuses: (-) nasal stuffiness or itchiness, (-) postnasal drip, (-) nosebleeds, (-) sinus trouble. Mouth and Throat: (-) sore throat, (-) hoarseness. Neck: (-) swollen glands, (-) enlarged thyroid, (-) neck pain. Respiratory: *** cough, *** shortness of breath, *** wheezing. Neurologic: *** numbness, *** tingling. Psychiatric: *** anxiety, *** depression   Current Medication: Outpatient Encounter Medications as of 11/19/2023  Medication Sig   Acetaminophen 500 MG capsule Take by mouth.   albuterol (VENTOLIN HFA) 108 (90 Base) MCG/ACT inhaler Inhale into the lungs.   amLODipine (NORVASC) 5 MG tablet Take 1 tablet by mouth at bedtime.   atorvastatin (LIPITOR) 40 MG tablet Take 1 tablet by mouth at bedtime.   benazepril-hydrochlorthiazide (LOTENSIN HCT) 20-25 MG tablet Take 1 tablet by mouth 2 (two) times daily.   Calcium Carb-Cholecalciferol (OYSTER SHELL CALCIUM) 500-400 MG-UNIT TABS Take by mouth.   Cholecalciferol 50 MCG (2000 UT) TABS Take by mouth.   Cholecalciferol 50 MCG (2000 UT) TABS Take by mouth.   cyanocobalamin 1000 MCG tablet Take by mouth.    dronedarone (MULTAQ) 400 MG tablet Take by mouth.   ELIQUIS 5 MG TABS tablet Take 5 mg by mouth 2 (two) times daily.   FARXIGA 10 MG TABS tablet Take 10 mg by mouth daily.   fexofenadine (ALLEGRA) 180 MG tablet Take by mouth.   fluticasone (FLONASE) 50 MCG/ACT nasal spray Place into the nose.   folic acid (FOLVITE) 800 MCG tablet Take by mouth.   furosemide (LASIX) 20 MG tablet Take 10 mg by mouth daily.   glipiZIDE (GLUCOTROL) 5 MG tablet Take by mouth.   insulin glargine (LANTUS) 100 UNIT/ML injection Inject into the skin.   meclizine (ANTIVERT) 12.5 MG tablet Take by mouth.   metFORMIN (GLUCOPHAGE) 1000 MG tablet Take by mouth.   metoprolol succinate (TOPROL-XL) 100 MG 24 hr tablet Take by mouth.   OZEMPIC, 1 MG/DOSE, 4 MG/3ML SOPN Inject 1 mg into the skin once a week.   Potassium Gluconate 2.5 MEQ TABS Take by mouth.   tiZANidine (ZANAFLEX) 2 MG tablet Take 2 mg by mouth at bedtime.   No facility-administered encounter medications on file as of 11/19/2023.    Surgical History: Past Surgical History:  Procedure Laterality Date   CESAREAN SECTION     ORIF DISTAL RADIUS FRACTURE      Medical History: Past Medical History:  Diagnosis Date   Atrial fibrillation (HCC)    Coronary artery disease    Diastolic heart failure (HCC)    DM (diabetes mellitus), type 2 (HCC)    Hyperlipidemia    OSA on CPAP  Reactive airway disease     Family History: Non contributory to the present illness  Social History: Social History   Socioeconomic History   Marital status: Married    Spouse name: Not on file   Number of children: Not on file   Years of education: Not on file   Highest education level: Not on file  Occupational History   Not on file  Tobacco Use   Smoking status: Never   Smokeless tobacco: Never  Substance and Sexual Activity   Alcohol use: Never   Drug use: Never   Sexual activity: Not on file  Other Topics Concern   Not on file  Social History Narrative    Not on file   Social Drivers of Health   Financial Resource Strain: Low Risk  (10/12/2023)   Received from [facility]   Overall Financial Resource Strain (CARDIA)    Difficulty of Paying Living Expenses: Not hard at all  Food Insecurity: No Food Insecurity (10/12/2023)   Received from [facility]   Hunger Vital Sign    Worried About Running Out of Food in the Last Year: Never true    Ran Out of Food in the Last Year: Never true  Transportation Needs: No Transportation Needs (10/12/2023)   Received from [facility] - Transportation    In the past 12 months, has lack of transportation kept you from medical appointments or from getting medications?: No    Lack of Transportation (Non-Medical): No  Physical Activity: Not on file  Stress: Not on file  Social Connections: Not on file  Intimate Partner Violence: Not on file    Vital Signs: There were no vitals taken for this visit. There is no height or weight on file to calculate BMI.    Examination: General Appearance: The patient is well-developed, well-nourished, and in no distress. Neck Circumference: *** Skin: Gross inspection of skin unremarkable. Head: normocephalic, no gross deformities. Eyes: no gross deformities noted. ENT: ears appear grossly normal Neurologic: Alert and oriented. No involuntary movements.  STOP BANG RISK ASSESSMENT S (snore) Have you been told that you snore?     YES/N   T (tired) Are you often tired, fatigued, or sleepy during the day?   YES/NO  O (obstruction) Do you stop breathing, choke, or gasp during sleep? YES/NO   P (pressure) Do you have or are you being treated for high blood pressure? YES/NO   B (BMI) Is your body index greater than 35 kg/m? YES/NO   A (age) Are you 37 years old or older? YES/NO   N (neck) Do you have a neck circumference greater than 16 inches?   YES/NO   G (gender) Are you a female? YES/NO   TOTAL  STOP/BANG "YES" ANSWERS        A STOP-Bang score of 2 or less is considered low risk, and a score of 5 or more is high risk for having either moderate or severe OSA. For people who score 3 or 4, doctors may need to perform further assessment to determine how likely they are to have OSA.         EPWORTH SLEEPINESS SCALE:  Scale:  (0)= no chance of dozing; (1)= slight chance of dozing; (2)= moderate chance of dozing; (3)= high chance of dozing  Chance  Situtation    Sitting and reading: ***    Watching TV: ***    Sitting Inactive in public: ***  As a passenger in car: ***      Lying down to rest: ***    Sitting and talking: ***    Sitting quielty after lunch: ***    In a car, stopped in traffic: ***   TOTAL SCORE:   *** out of 24    SLEEP STUDIES:  Split - 06/19/16 ([facility]) - AHI 39/hr  min Sp02 88%  CPAP @7cmH20    CPAP COMPLIANCE DATA:  Date Range: 11/15/2022-11/14/2023  Average Daily Use: 9 hours20min  Median Use: 9 hrs 20 min  Compliance for > 4 Hours: 98% days  AHI: 2.3 respiratory events per hour  Days Used: 359/365  Mask Leak: 6.2  95th Percentile Pressure: 10cmh20         LABS: No results found for this or any previous visit (from the past 2160 hours).  Radiology: DG Knee Complete 4 Views Right Result Date: 07/02/2020 CLINICAL DATA:  Left knee pain after fall. EXAM: RIGHT KNEE - COMPLETE 4+ VIEW COMPARISON:  None. FINDINGS: No evidence of fracture, dislocation, or joint effusion. No evidence of arthropathy or other focal bone abnormality. Vascular calcifications are noted. IMPRESSION: Negative. Electronically Signed   By: Lupita Raider M.D.   On: 07/02/2020 13:47   CT Head Wo Contrast Result Date: 07/02/2020 CLINICAL DATA:  Patient tripped on curb getting out of her car today. Cut right side of forehead. Abrasions below right eye and both knees.Facial trauma EXAM: CT HEAD WITHOUT CONTRAST CT MAXILLOFACIAL WITHOUT CONTRAST CT CERVICAL SPINE  WITHOUT CONTRAST TECHNIQUE: Multidetector CT imaging of the head, cervical spine, and maxillofacial structures were performed using the standard protocol without intravenous contrast. Multiplanar CT image reconstructions of the cervical spine and maxillofacial structures were also generated. COMPARISON:  None. FINDINGS: CT HEAD FINDINGS Brain: No acute intracranial hemorrhage. No focal mass lesion. No CT evidence of acute infarction. No midline shift or mass effect. No hydrocephalus. Basilar cisterns are patent. Vascular: No hyperdense vessel or unexpected calcification. Skull: Normal. Negative for fracture or focal lesion. Incidental hyperostosis frontalis. Sinuses/Orbits: Paranasal sinuses and mastoid air cells are clear. Orbits are clear. Other: Shallow scalp hematoma over the RIGHT frontal bone. CT MAXILLOFACIAL FINDINGS Osseous: Orbital rims are intact. Maxillary sinus walls are intact. Pterygoid plates are normal. Segment arches are normal. Mandibular condyles are located. No mandibular fracture. Orbits: No proptosis. Globes intact. Intraconal contents are normal. Mild swelling the preseptal soft tissue over the RIGHT eye. Sinuses: No fluid in the paranasal sinuses Soft tissues: . small RIGHT frontal scalp hematoma CT CERVICAL SPINE FINDINGS Alignment: Normal alignment of the cervical vertebral bodies. Skull base and vertebrae: Normal craniocervical junction. No loss of vertebral body height or disc height. Normal facet articulation. No evidence of fracture. Soft tissues and spinal canal: No prevertebral soft tissue swelling. No perispinal or epidural hematoma. Disc levels:  Unremarkable Upper chest: Clear Other: None IMPRESSION: 1. No intracranial trauma. 2. Shallow RIGHT frontal scalp hematoma.  No skull fracture 3. Mild soft tissue preseptal swelling on the RIGHT. No orbital injury. 4. No facial bone fracture. 5. No cervical spine fracture Electronically Signed   By: Genevive Bi M.D.   On: 07/02/2020  13:41   CT Cervical Spine Wo Contrast Result Date: 07/02/2020 CLINICAL DATA:  Patient tripped on curb getting out of her car today. Cut right side of forehead. Abrasions below right eye and both knees.Facial trauma EXAM: CT HEAD WITHOUT CONTRAST CT MAXILLOFACIAL WITHOUT CONTRAST CT CERVICAL SPINE WITHOUT CONTRAST TECHNIQUE: Multidetector CT imaging of the head,  cervical spine, and maxillofacial structures were performed using the standard protocol without intravenous contrast. Multiplanar CT image reconstructions of the cervical spine and maxillofacial structures were also generated. COMPARISON:  None. FINDINGS: CT HEAD FINDINGS Brain: No acute intracranial hemorrhage. No focal mass lesion. No CT evidence of acute infarction. No midline shift or mass effect. No hydrocephalus. Basilar cisterns are patent. Vascular: No hyperdense vessel or unexpected calcification. Skull: Normal. Negative for fracture or focal lesion. Incidental hyperostosis frontalis. Sinuses/Orbits: Paranasal sinuses and mastoid air cells are clear. Orbits are clear. Other: Shallow scalp hematoma over the RIGHT frontal bone. CT MAXILLOFACIAL FINDINGS Osseous: Orbital rims are intact. Maxillary sinus walls are intact. Pterygoid plates are normal. Segment arches are normal. Mandibular condyles are located. No mandibular fracture. Orbits: No proptosis. Globes intact. Intraconal contents are normal. Mild swelling the preseptal soft tissue over the RIGHT eye. Sinuses: No fluid in the paranasal sinuses Soft tissues: . small RIGHT frontal scalp hematoma CT CERVICAL SPINE FINDINGS Alignment: Normal alignment of the cervical vertebral bodies. Skull base and vertebrae: Normal craniocervical junction. No loss of vertebral body height or disc height. Normal facet articulation. No evidence of fracture. Soft tissues and spinal canal: No prevertebral soft tissue swelling. No perispinal or epidural hematoma. Disc levels:  Unremarkable Upper chest: Clear Other:  None IMPRESSION: 1. No intracranial trauma. 2. Shallow RIGHT frontal scalp hematoma.  No skull fracture 3. Mild soft tissue preseptal swelling on the RIGHT. No orbital injury. 4. No facial bone fracture. 5. No cervical spine fracture Electronically Signed   By: Genevive Bi M.D.   On: 07/02/2020 13:41   CT Maxillofacial Wo Contrast Result Date: 07/02/2020 CLINICAL DATA:  Patient tripped on curb getting out of her car today. Cut right side of forehead. Abrasions below right eye and both knees.Facial trauma EXAM: CT HEAD WITHOUT CONTRAST CT MAXILLOFACIAL WITHOUT CONTRAST CT CERVICAL SPINE WITHOUT CONTRAST TECHNIQUE: Multidetector CT imaging of the head, cervical spine, and maxillofacial structures were performed using the standard protocol without intravenous contrast. Multiplanar CT image reconstructions of the cervical spine and maxillofacial structures were also generated. COMPARISON:  None. FINDINGS: CT HEAD FINDINGS Brain: No acute intracranial hemorrhage. No focal mass lesion. No CT evidence of acute infarction. No midline shift or mass effect. No hydrocephalus. Basilar cisterns are patent. Vascular: No hyperdense vessel or unexpected calcification. Skull: Normal. Negative for fracture or focal lesion. Incidental hyperostosis frontalis. Sinuses/Orbits: Paranasal sinuses and mastoid air cells are clear. Orbits are clear. Other: Shallow scalp hematoma over the RIGHT frontal bone. CT MAXILLOFACIAL FINDINGS Osseous: Orbital rims are intact. Maxillary sinus walls are intact. Pterygoid plates are normal. Segment arches are normal. Mandibular condyles are located. No mandibular fracture. Orbits: No proptosis. Globes intact. Intraconal contents are normal. Mild swelling the preseptal soft tissue over the RIGHT eye. Sinuses: No fluid in the paranasal sinuses Soft tissues: . small RIGHT frontal scalp hematoma CT CERVICAL SPINE FINDINGS Alignment: Normal alignment of the cervical vertebral bodies. Skull base and  vertebrae: Normal craniocervical junction. No loss of vertebral body height or disc height. Normal facet articulation. No evidence of fracture. Soft tissues and spinal canal: No prevertebral soft tissue swelling. No perispinal or epidural hematoma. Disc levels:  Unremarkable Upper chest: Clear Other: None IMPRESSION: 1. No intracranial trauma. 2. Shallow RIGHT frontal scalp hematoma.  No skull fracture 3. Mild soft tissue preseptal swelling on the RIGHT. No orbital injury. 4. No facial bone fracture. 5. No cervical spine fracture Electronically Signed   By: Loura Halt.D.  On: 07/02/2020 13:41    No results found.  No results found.    Assessment and Plan: Patient Active Problem List   Diagnosis Date Noted   Cardiac mass 11/13/2022   Obesity (BMI 30-39.9) 11/13/2022   Diastolic heart failure (HCC) 06/13/2021   OSA on CPAP 06/13/2021   CPAP use counseling 06/13/2021   Atrial fibrillation (HCC) 06/13/2021   Atrial fibrillation, new onset (HCC) 04/30/2019   Beta thalassemia trait 10/09/2018   Type 2 diabetes mellitus with microalbuminuria, with long-term current use of insulin (HCC) 10/09/2018   Obstructive sleep apnea 06/26/2016   Morbid obesity with BMI of 40.0-44.9, adult (HCC) 09/10/2014   Coronary artery disease involving native coronary artery of native heart with angina pectoris (HCC) 08/20/2014   Leukocytosis 07/21/2014   Peripheral neuropathy 01/15/2013   Low vitamin B12 level 01/15/2013   RAD (reactive airway disease) 04/16/2012   Low back pain with sciatica 04/28/2011   Essential hypertension 04/17/2011   Mixed hyperlipidemia 04/17/2011      The patient *** tolerate PAP and reports *** benefit from PAP use. The patient was reminded how to *** and advised to ***. The patient was also counselled on ***. The compliance is ***. The AHI is ***.   ***  General Counseling: I have discussed the findings of the evaluation and examination with Sheila Bush.  I have also  discussed any further diagnostic evaluation thatmay be needed or ordered today. Sheila Bush verbalizes understanding of the findings of todays visit. We also reviewed her medications today and discussed drug interactions and side effects including but not limited excessive drowsiness and altered mental states. We also discussed that there is always a risk not just to her but also people around her. she has been encouraged to call the office with any questions or concerns that should arise related to todays visit.  No orders of the defined types were placed in this encounter.       I have personally obtained a history, examined the patient, evaluated laboratory and imaging results, formulated the assessment and plan and placed orders.  Yevonne Pax, MD [facility] Diplomate ABMS Pulmonary Critical Care Medicine and Sleep Medicine

## 2023-11-19 ENCOUNTER — Ambulatory Visit: Payer: Medicare Other | Admitting: Internal Medicine

## 2024-01-25 NOTE — Progress Notes (Signed)
 No show

## 2024-01-28 ENCOUNTER — Ambulatory Visit: Admitting: Internal Medicine
